# Patient Record
Sex: Female | Born: 1990 | Hispanic: Yes | Marital: Single | Smoking: Never smoker
Health system: Southern US, Community
[De-identification: ages and names within clinical notes are randomized; demographics above are authoritative.]

## PROBLEM LIST (undated history)

## (undated) DIAGNOSIS — E042 Nontoxic multinodular goiter: Secondary | ICD-10-CM

## (undated) DIAGNOSIS — E559 Vitamin D deficiency, unspecified: Secondary | ICD-10-CM

## (undated) DIAGNOSIS — L680 Hirsutism: Secondary | ICD-10-CM

## (undated) HISTORY — DX: Hirsutism: L68.0

## (undated) HISTORY — PX: NO PAST SURGERIES: SHX2092

## (undated) HISTORY — DX: Vitamin D deficiency, unspecified: E55.9

## (undated) HISTORY — DX: Nontoxic multinodular goiter: E04.2

## (undated) HISTORY — DX: Morbid (severe) obesity due to excess calories: E66.01

---

## 2019-01-04 ENCOUNTER — Other Ambulatory Visit: Payer: Self-pay

## 2019-01-04 ENCOUNTER — Emergency Department
Admission: EM | Admit: 2019-01-04 | Discharge: 2019-01-04 | Disposition: A | Discharge status: Home / Self Care | Attending: Emergency Medicine | Admitting: Emergency Medicine

## 2019-01-04 ENCOUNTER — Encounter: Payer: Self-pay | Admitting: Emergency Medicine

## 2019-01-04 DIAGNOSIS — R112 Nausea with vomiting, unspecified: Secondary | ICD-10-CM

## 2019-01-04 DIAGNOSIS — Z6841 Body Mass Index (BMI) 40.0 and over, adult: Secondary | ICD-10-CM | POA: Insufficient documentation

## 2019-01-04 DIAGNOSIS — R197 Diarrhea, unspecified: Secondary | ICD-10-CM

## 2019-01-04 DIAGNOSIS — E860 Dehydration: Secondary | ICD-10-CM | POA: Diagnosis not present

## 2019-01-04 LAB — GASTROINTESTINAL PANEL BY PCR, STOOL (REPLACES STOOL CULTURE)
Adenovirus F40/41: NOT DETECTED
Astrovirus: NOT DETECTED
CYCLOSPORA CAYETANENSIS: NOT DETECTED
Campylobacter species: NOT DETECTED
Cryptosporidium: NOT DETECTED
ENTAMOEBA HISTOLYTICA: NOT DETECTED
Enteroaggregative E coli (EAEC): NOT DETECTED
Enteropathogenic E coli (EPEC): NOT DETECTED
Enterotoxigenic E coli (ETEC): NOT DETECTED
Giardia lamblia: NOT DETECTED
Norovirus GI/GII: DETECTED — AB
Plesimonas shigelloides: NOT DETECTED
Rotavirus A: NOT DETECTED
Salmonella species: NOT DETECTED
Sapovirus (I, II, IV, and V): NOT DETECTED
Shiga like toxin producing E coli (STEC): NOT DETECTED
Shigella/Enteroinvasive E coli (EIEC): NOT DETECTED
VIBRIO CHOLERAE: NOT DETECTED
Vibrio species: NOT DETECTED
YERSINIA ENTEROCOLITICA: NOT DETECTED

## 2019-01-04 LAB — CBC WITH DIFFERENTIAL/PLATELET
Abs Immature Granulocytes: 0.06 10*3/uL (ref 0.00–0.07)
Basophils Absolute: 0 10*3/uL (ref 0.0–0.1)
Basophils Relative: 0 %
Eosinophils Absolute: 0 10*3/uL (ref 0.0–0.5)
Eosinophils Relative: 0 %
HCT: 45.2 % (ref 36.0–46.0)
Hemoglobin: 14.3 g/dL (ref 12.0–15.0)
Immature Granulocytes: 0 %
Lymphocytes Relative: 2 %
Lymphs Abs: 0.3 10*3/uL — ABNORMAL LOW (ref 0.7–4.0)
MCH: 25.9 pg — ABNORMAL LOW (ref 26.0–34.0)
MCHC: 31.6 g/dL (ref 30.0–36.0)
MCV: 81.7 fL (ref 80.0–100.0)
Monocytes Absolute: 0.4 10*3/uL (ref 0.1–1.0)
Monocytes Relative: 3 %
Neutro Abs: 14 10*3/uL — ABNORMAL HIGH (ref 1.7–7.7)
Neutrophils Relative %: 95 %
Platelets: 334 10*3/uL (ref 150–400)
RBC: 5.53 MIL/uL — ABNORMAL HIGH (ref 3.87–5.11)
RDW: 14.2 % (ref 11.5–15.5)
WBC: 14.8 10*3/uL — ABNORMAL HIGH (ref 4.0–10.5)
nRBC: 0 % (ref 0.0–0.2)

## 2019-01-04 LAB — COMPREHENSIVE METABOLIC PANEL
ALT: 18 U/L (ref 0–44)
AST: 22 U/L (ref 15–41)
Albumin: 4.2 g/dL (ref 3.5–5.0)
Alkaline Phosphatase: 69 U/L (ref 38–126)
Anion gap: 9 (ref 5–15)
BUN: 20 mg/dL (ref 6–20)
CO2: 23 mmol/L (ref 22–32)
Calcium: 9.2 mg/dL (ref 8.9–10.3)
Chloride: 103 mmol/L (ref 98–111)
Creatinine, Ser: 0.9 mg/dL (ref 0.44–1.00)
GFR calc Af Amer: >60 mL/min (ref >60)
GFR calc non Af Amer: >60 mL/min (ref >60)
Glucose, Bld: 160 mg/dL — ABNORMAL HIGH (ref 70–99)
POTASSIUM: 3.9 mmol/L (ref 3.5–5.1)
Sodium: 135 mmol/L (ref 135–145)
Total Bilirubin: 0.8 mg/dL (ref 0.3–1.2)
Total Protein: 8.5 g/dL — ABNORMAL HIGH (ref 6.5–8.1)

## 2019-01-04 LAB — URINALYSIS, COMPLETE (UACMP) WITH MICROSCOPIC
Bilirubin Urine: NEGATIVE
GLUCOSE, UA: NEGATIVE mg/dL
Ketones, ur: NEGATIVE mg/dL
Nitrite: NEGATIVE
Protein, ur: NEGATIVE mg/dL
Specific Gravity, Urine: 1.031 — ABNORMAL HIGH (ref 1.005–1.030)
pH: 5 (ref 5.0–8.0)

## 2019-01-04 LAB — C DIFFICILE QUICK SCREEN W PCR REFLEX
C Diff antigen: NEGATIVE
C Diff interpretation: NOT DETECTED
C Diff toxin: NEGATIVE

## 2019-01-04 LAB — LIPASE, BLOOD: Lipase: 26 U/L (ref 11–51)

## 2019-01-04 LAB — BETA-HYDROXYBUTYRIC ACID: Beta-Hydroxybutyric Acid: 0.09 mmol/L (ref 0.05–0.27)

## 2019-01-04 MED ORDER — ONDANSETRON HCL 4 MG/2ML IJ SOLN
4.0000 mg | Freq: Once | INTRAMUSCULAR | Status: AC
  Administered 2019-01-04: 4 mg via INTRAVENOUS
  Filled 2019-01-04: qty 2

## 2019-01-04 MED ORDER — SODIUM CHLORIDE 0.9 % IV BOLUS
1000.0000 mL | Freq: Once | INTRAVENOUS | Status: AC
  Administered 2019-01-04: 1000 mL via INTRAVENOUS

## 2019-01-04 MED ORDER — LOPERAMIDE HCL 2 MG PO CAPS
4.0000 mg | ORAL_CAPSULE | Freq: Once | ORAL | Status: AC
  Administered 2019-01-04: 4 mg via ORAL
  Filled 2019-01-04: qty 2

## 2019-01-04 MED ORDER — ONDANSETRON 4 MG PO TBDP: 4.0000 mg | ORAL_TABLET | Freq: Three times a day (TID) | ORAL | 0 refills | Status: DC | PRN

## 2019-01-04 MED ORDER — DICYCLOMINE HCL 10 MG/ML IM SOLN
20.0000 mg | Freq: Once | INTRAMUSCULAR | Status: AC
  Administered 2019-01-04: 20 mg via INTRAMUSCULAR
  Filled 2019-01-04: qty 2

## 2019-01-04 NOTE — ED Notes (Signed)
Topaz not working 

## 2019-01-04 NOTE — ED Provider Notes (Signed)
[facility] Emergency Department Provider Note  ____________________________________________   First MD Initiated Contact with Patient 01/04/19 [phone]     (approximate)  I have reviewed the triage vital signs and the nursing notes.   HISTORY  Chief Complaint Emesis and Diarrhea   HPI Jessica Mcfarland is a 28 y.o. female who self presents to the emergency department with nausea vomiting and diarrhea that began acutely at 10 PM last night.  The patient recently took a course of azithromycin and amoxicillin that she purchased over-the-counter in [place] for unclear reasons.  She did not see a doctor and said she took the medications for being "sick".  She is also changed her diet recently to plant-based and organic although that was 4 weeks ago.  Her symptoms came on suddenly were severe and she has had more bowel movements than she can count.  It is all "liquid" coming out.  No history of abdominal surgeries.  No fevers or chills.  Her abdominal pain is moderate severity cramping diffuse nonradiating.    History reviewed. No pertinent past medical history.  There are no active problems to display for this patient.   History reviewed. No pertinent surgical history.  Prior to Admission medications   Medication Sig Start Date End Date Taking? Authorizing Provider  ondansetron (ZOFRAN ODT) 4 MG disintegrating tablet Take 1 tablet (4 mg total) by mouth every 8 (eight) hours as needed for nausea or vomiting. 01/04/19   Merrily Brittle, MD    Allergies Patient has no known allergies.  No family history on file.  Social History Social History   Tobacco Use  . Smoking status: Not on file  Substance Use Topics  . Alcohol use: Not on file  . Drug use: Not on file    Review of Systems Constitutional: No fever/chills Eyes: No visual changes. ENT: No sore throat. Cardiovascular: Denies chest pain. Respiratory: Denies shortness of  breath. Gastrointestinal: Positive for abdominal pain.  Positive for nausea, positive for vomiting.  Positive for diarrhea.  No constipation. Genitourinary: Negative for dysuria. Musculoskeletal: Negative for back pain. Skin: Negative for rash. Neurological: Negative for headaches, focal weakness or numbness.   ____________________________________________   PHYSICAL EXAM:  VITAL SIGNS: ED Triage Vitals  Enc Vitals Group     BP 01/04/19 0538 118/63     Pulse Rate 01/04/19 0538 (!) 125     Resp 01/04/19 0538 20     Temp 01/04/19 0538 98.5 F (36.9 C)     Temp Source 01/04/19 0538 Oral     SpO2 01/04/19 0538 98 %     Weight 01/04/19 0537 287 lb (130.2 kg)     Height 01/04/19 0537 5\' 9"  (1.753 m)     Head Circumference --      Peak Flow --      Pain Score 01/04/19 0537 0     Pain Loc --      Pain Edu? --      Excl. in GC? --     Constitutional: Alert and oriented x4 appears somewhat uncomfortable nontoxic no diaphoresis speaks in full clear sentences Eyes: PERRL EOMI. Head: Atraumatic. Nose: No congestion/rhinnorhea. Mouth/Throat: No trismus Neck: No stridor.   Cardiovascular: Tachycardic rate, regular rhythm. Grossly normal heart sounds.  Good peripheral circulation. Respiratory: Normal respiratory effort.  No retractions. Lungs CTAB and moving good air Gastrointestinal: Morbidly obese abdomen soft mild diffuse tenderness with no focality no rebound or guarding no peritonitis hyperactive bowel sounds Musculoskeletal:  No lower extremity edema   Neurologic:  Normal speech and language. No gross focal neurologic deficits are appreciated. Skin:  Skin is warm, dry and intact. No rash noted. Psychiatric: Mood and affect are normal. Speech and behavior are normal.    ____________________________________________   DIFFERENTIAL includes but not limited to  Viral gastroenteritis, bacterial gastroenteritis, C. difficile, appendicitis,  diverticulitis ____________________________________________   LABS (all labs ordered are listed, but only abnormal results are displayed)  Labs Reviewed  COMPREHENSIVE METABOLIC PANEL - Abnormal; Notable for the following components:      Result Value   Glucose, Bld 160 (*)    Total Protein 8.5 (*)    All other components within normal limits  CBC WITH DIFFERENTIAL/PLATELET - Abnormal; Notable for the following components:   WBC 14.8 (*)    RBC 5.53 (*)    MCH 25.9 (*)    Neutro Abs 14.0 (*)    Lymphs Abs 0.3 (*)    All other components within normal limits  URINALYSIS, COMPLETE (UACMP) WITH MICROSCOPIC - Abnormal; Notable for the following components:   Color, Urine YELLOW (*)    APPearance TURBID (*)    Specific Gravity, Urine 1.031 (*)    Hgb urine dipstick SMALL (*)    Leukocytes, UA MODERATE (*)    Bacteria, UA MANY (*)    All other components within normal limits  GASTROINTESTINAL PANEL BY PCR, STOOL (REPLACES STOOL CULTURE)  C DIFFICILE QUICK SCREEN W PCR REFLEX  LIPASE, BLOOD  BETA-HYDROXYBUTYRIC ACID    Lab work reviewed by me with slightly elevated white count which is nonspecific otherwise unremarkable __________________________________________  EKG   ____________________________________________  RADIOLOGY  ____________________________________________   PROCEDURES  Procedure(s) performed: no  Procedures  Critical Care performed: no  ____________________________________________   INITIAL IMPRESSION / ASSESSMENT AND PLAN / ED COURSE  Pertinent labs & imaging results that were available during my care of the patient were reviewed by me and considered in my medical decision making (see chart for details).   As part of my medical decision making, I reviewed the following data within the electronic MEDICAL RECORD NUMBER History obtained from family if available, nursing notes, old chart and ekg, as well as notes from prior ED visits.  Patient comes to  the emergency department somewhat uncomfortable appearing with cramping diffuse abdominal pain nausea vomiting and diarrhea that began acutely last night.  Her symptoms are most consistent with either food poisoning or infectious gastroenteritis.  Given a liter of fluids with IV Zofran and intramuscular Bentyl with improvement in her symptoms.  We did send off a stool sample given her recent antibiotic use to evaluate for C. difficile.  Will be discharged home with loperamide and Zofran.  Strict return precautions have been given and the patient verbalizes understanding and agreement with the plan.      ____________________________________________   FINAL CLINICAL IMPRESSION(S) / ED DIAGNOSES  Final diagnoses:  Dehydration  Nausea vomiting and diarrhea  Morbid obesity (HCC)      NEW MEDICATIONS STARTED DURING THIS VISIT:  New Prescriptions   ONDANSETRON (ZOFRAN ODT) 4 MG DISINTEGRATING TABLET    Take 1 tablet (4 mg total) by mouth every 8 (eight) hours as needed for nausea or vomiting.     Note:  This document was prepared using Dragon voice recognition software and may include unintentional dictation errors.    Merrily Brittle, MD 01/04/19 [phone]

## 2019-01-04 NOTE — ED Triage Notes (Signed)
Patient ambulatory to triage with steady gait, without difficulty or distress noted; pt reports N/V/D since 10pm; began organic and plant based food diet 4wks ago

## 2019-01-04 NOTE — Discharge Instructions (Addendum)
Please take over the counter loperamide as needed for diarrhea.  Take one tablet after every loose stool up to a total of 8 a day.  Use zofran as needed for nausea and follow up with primary care this coming Friday for a recheck.  Return to the ED sooner for any concerns such as worsening pain, fevers, chills, if you cannot eat or drink, or for any other concerns whatsoever.  It was a pleasure to take care of you today, and thank you for coming to our emergency department.  If you have any questions or concerns before leaving please ask the nurse to grab me and I'm more than happy to go through your aftercare instructions again.  If you were prescribed any opioid pain medication today such as Norco, Vicodin, Percocet, morphine, hydrocodone, or oxycodone please make sure you do not drive when you are taking this medication as it can alter your ability to drive safely.  If you have any concerns once you are home that you are not improving or are in fact getting worse before you can make it to your follow-up appointment, please do not hesitate to call 911 and come back for further evaluation.  Merrily Brittle, MD  Results for orders placed or performed during the hospital encounter of 01/04/19  Comprehensive metabolic panel  Result Value Ref Range   Sodium 135 135 - 145 mmol/L   Potassium 3.9 3.5 - 5.1 mmol/L   Chloride 103 98 - 111 mmol/L   CO2 23 22 - 32 mmol/L   Glucose, Bld 160 (H) 70 - 99 mg/dL   BUN 20 6 - 20 mg/dL   Creatinine, Ser 7.10 0.44 - 1.00 mg/dL   Calcium 9.2 8.9 - 62.6 mg/dL   Total Protein 8.5 (H) 6.5 - 8.1 g/dL   Albumin 4.2 3.5 - 5.0 g/dL   AST 22 15 - 41 U/L   ALT 18 0 - 44 U/L   Alkaline Phosphatase 69 38 - 126 U/L   Total Bilirubin 0.8 0.3 - 1.2 mg/dL   GFR calc non Af Amer >60 >60 mL/min   GFR calc Af Amer >60 >60 mL/min   Anion gap 9 5 - 15  Lipase, blood  Result Value Ref Range   Lipase 26 11 - 51 U/L  CBC with Differential  Result Value Ref Range   WBC 14.8  (H) 4.0 - 10.5 K/uL   RBC 5.53 (H) 3.87 - 5.11 MIL/uL   Hemoglobin 14.3 12.0 - 15.0 g/dL   HCT 94.8 54.6 - 27.0 %   MCV 81.7 80.0 - 100.0 fL   MCH 25.9 (L) 26.0 - 34.0 pg   MCHC 31.6 30.0 - 36.0 g/dL   RDW 35.0 09.3 - 81.8 %   Platelets 334 150 - 400 K/uL   nRBC 0.0 0.0 - 0.2 %   Neutrophils Relative % 95 %   Neutro Abs 14.0 (H) 1.7 - 7.7 K/uL   Lymphocytes Relative 2 %   Lymphs Abs 0.3 (L) 0.7 - 4.0 K/uL   Monocytes Relative 3 %   Monocytes Absolute 0.4 0.1 - 1.0 K/uL   Eosinophils Relative 0 %   Eosinophils Absolute 0.0 0.0 - 0.5 K/uL   Basophils Relative 0 %   Basophils Absolute 0.0 0.0 - 0.1 K/uL   Immature Granulocytes 0 %   Abs Immature Granulocytes 0.06 0.00 - 0.07 K/uL

## 2019-01-05 NOTE — ED Notes (Signed)
Called patient with gi panel results.  She has not had diarrhea today.  Told her not to take any more anti diarrheals unless she gets diarrhea again.  Also explained importance of washing hands with soap and water as opposed to hand gels.

## 2020-03-03 ENCOUNTER — Ambulatory Visit: Attending: Internal Medicine

## 2020-03-03 DIAGNOSIS — Z23 Encounter for immunization: Secondary | ICD-10-CM

## 2020-03-03 NOTE — Progress Notes (Signed)
   Covid-19 Vaccination Clinic  Name:  Timia Casselman    MRN: [id number] DOB: [birth year]  03/03/2020  Ms. Trinna Balloon was observed post Covid-19 immunization for 15 minutes without incident. She was provided with Vaccine Information Sheet and instruction to access the V-Safe system.   Ms. Trinna Balloon was instructed to call 911 with any severe reactions post vaccine: Marland Kitchen Difficulty breathing  . Swelling of face and throat  . A fast heartbeat  . A bad rash all over body  . Dizziness and weakness   Immunizations Administered    Name Date Dose VIS Date Route   Pfizer COVID-19 Vaccine 03/03/2020 11:34 AM 0.3 mL 11/24/2019 Intramuscular   Manufacturer: [employer], [employer]   Lot: [id number]   NDC: [address]-2

## 2020-03-24 ENCOUNTER — Ambulatory Visit: Attending: Internal Medicine

## 2020-03-24 DIAGNOSIS — Z23 Encounter for immunization: Secondary | ICD-10-CM

## 2020-03-24 NOTE — Progress Notes (Signed)
   Covid-19 Vaccination Clinic  Name:  Breianna Delfino    MRN: [id number] DOB: Feb 21, 1991  03/24/2020  Ms. Trinna Balloon was observed post Covid-19 immunization for 15 minutes without incident. She was provided with Vaccine Information Sheet and instruction to access the V-Safe system.   Ms. Trinna Balloon was instructed to call 911 with any severe reactions post vaccine: Marland Kitchen Difficulty breathing  . Swelling of face and throat  . A fast heartbeat  . A bad rash all over body  . Dizziness and weakness   Immunizations Administered    Name Date Dose VIS Date Route   Pfizer COVID-19 Vaccine 03/24/2020 11:34 AM 0.3 mL 11/24/2019 Intramuscular   Manufacturer: [employer], [employer]   Lot: [id number]   NDC: [address]-3

## 2020-11-11 ENCOUNTER — Other Ambulatory Visit: Payer: Self-pay

## 2020-11-11 ENCOUNTER — Ambulatory Visit
Admission: EM | Admit: 2020-11-11 | Discharge: 2020-11-11 | Disposition: A | Discharge status: Home / Self Care | Attending: Emergency Medicine | Admitting: Emergency Medicine

## 2020-11-11 DIAGNOSIS — Z20822 Contact with and (suspected) exposure to covid-19: Secondary | ICD-10-CM | POA: Insufficient documentation

## 2020-11-11 DIAGNOSIS — R112 Nausea with vomiting, unspecified: Secondary | ICD-10-CM | POA: Diagnosis present

## 2020-11-11 DIAGNOSIS — R197 Diarrhea, unspecified: Secondary | ICD-10-CM | POA: Diagnosis not present

## 2020-11-11 LAB — RESP PANEL BY RT-PCR (FLU A&B, COVID) ARPGX2
Influenza A by PCR: NEGATIVE
Influenza B by PCR: NEGATIVE
SARS Coronavirus 2 by RT PCR: NEGATIVE

## 2020-11-11 MED ORDER — ONDANSETRON 8 MG PO TBDP: 8.0000 mg | ORAL_TABLET | Freq: Three times a day (TID) | ORAL | 0 refills | Status: DC | PRN

## 2020-11-11 MED ORDER — DICYCLOMINE HCL 20 MG PO TABS: 20.0000 mg | ORAL_TABLET | Freq: Two times a day (BID) | ORAL | 0 refills | Status: DC

## 2020-11-11 NOTE — ED Provider Notes (Signed)
[facility]    CSN: [id number] Arrival date & time: 11/11/20  1106      History   Chief Complaint Chief Complaint  Patient presents with   Generalized Body Aches    HPI Breslyn Abdo is a 29 y.o. female.   HPI   29 year old female here for evaluation of fever, chills, body aches, nausea, vomiting, and diarrhea.  Patient reports that her symptoms started 2 days ago.  She was recently with her family for Thanksgiving and her dad had a similar symptoms the day prior the last 24 hours.  Patient reports that the highest her measured temp Was 99.  She has been taking a Z-Pak that she got over-the-counter when she was last in [place]  She denies URI symptoms, or changes in appetite.  She is not seen any blood in her stool.  She does report that she is going 6-7 times a day for moderate volumes of stool.  Patient is able to take in oral fluids.  She does report whenever she eats that it goes right through her.  History reviewed. No pertinent past medical history.  There are no problems to display for this patient.   Past Surgical History:  Procedure Laterality Date   NO PAST SURGERIES      OB History   No obstetric history on file.      Home Medications    Prior to Admission medications   Medication Sig Start Date End Date Taking? Authorizing Provider  dicyclomine (BENTYL) 20 MG tablet Take 1 tablet (20 mg total) by mouth 2 (two) times daily. 11/11/20   Becky Augusta, NP  ondansetron (ZOFRAN ODT) 8 MG disintegrating tablet Take 1 tablet (8 mg total) by mouth every 8 (eight) hours as needed for nausea or vomiting. 11/11/20   Becky Augusta, NP    Family History Family History  Problem Relation Age of Onset   Arthritis Mother    Healthy Father     Social History Social History   Tobacco Use   Smoking status: Never Smoker   Smokeless tobacco: Never Used  Building services engineer Use: Never used  Substance Use Topics   Alcohol use:  Yes    Comment: socially   Drug use: Never     Allergies   Patient has no known allergies.   Review of Systems Review of Systems  Constitutional: Negative for activity change, appetite change and fever.  HENT: Negative for congestion, ear pain, sinus pain and sore throat.   Respiratory: Negative for cough and shortness of breath.   Cardiovascular: Negative for chest pain.  Gastrointestinal: Positive for abdominal pain, diarrhea, nausea and vomiting. Negative for blood in stool.  Genitourinary: Negative for dysuria, frequency and urgency.  Musculoskeletal: Positive for arthralgias and myalgias.  Skin: Negative for rash.  Neurological: Negative for syncope and headaches.  Hematological: Negative.   Psychiatric/Behavioral: Negative.      Physical Exam Triage Vital Signs ED Triage Vitals  Enc Vitals Group     BP 11/11/20 1238 (!) 146/99     Pulse Rate 11/11/20 1238 100     Resp 11/11/20 1238 18     Temp 11/11/20 1238 98.4 F (36.9 C)     Temp Source 11/11/20 1238 Oral     SpO2 11/11/20 1238 99 %     Weight 11/11/20 1235 (!) 304 lb (137.9 kg)     Height 11/11/20 1235 5\' 9"  (1.753 m)     Head Circumference --  Peak Flow --      Pain Score 11/11/20 1235 7     Pain Loc --      Pain Edu? --      Excl. in GC? --    No data found.  Updated Vital Signs BP (!) 146/99 (BP Location: Left Arm)    Pulse 100    Temp 98.4 F (36.9 C) (Oral)    Resp 18    Ht 5\' 9"  (1.753 m)    Wt (!) 304 lb (137.9 kg)    LMP 10/28/2020    SpO2 99%    BMI 44.89 kg/m   Visual Acuity Right Eye Distance:   Left Eye Distance:   Bilateral Distance:    Right Eye Near:   Left Eye Near:    Bilateral Near:     Physical Exam Vitals and nursing note reviewed.  Constitutional:      General: She is not in acute distress.    Appearance: Normal appearance. She is obese. She is not toxic-appearing.  HENT:     Head: Normocephalic and atraumatic.     Right Ear: Tympanic membrane, ear canal and  external ear normal. There is no impacted cerumen.     Left Ear: Tympanic membrane, ear canal and external ear normal.  Eyes:     General: No scleral icterus.    Extraocular Movements: Extraocular movements intact.     Conjunctiva/sclera: Conjunctivae normal.     Pupils: Pupils are equal, round, and reactive to light.  Cardiovascular:     Rate and Rhythm: Normal rate and regular rhythm.     Pulses: Normal pulses.     Heart sounds: Normal heart sounds. No murmur heard.  No gallop.   Pulmonary:     Effort: Pulmonary effort is normal.     Breath sounds: Normal breath sounds. No wheezing, rhonchi or rales.  Abdominal:     General: Bowel sounds are normal.     Palpations: Abdomen is soft.     Tenderness: There is no abdominal tenderness. There is no right CVA tenderness, guarding or rebound.  Musculoskeletal:        General: No swelling. Normal range of motion.     Cervical back: Normal range of motion and neck supple.  Lymphadenopathy:     Cervical: No cervical adenopathy.  Skin:    General: Skin is warm and dry.     Capillary Refill: Capillary refill takes less than 2 seconds.     Findings: No bruising, erythema or rash.  Neurological:     General: No focal deficit present.     Mental Status: She is alert and oriented to person, place, and time.  Psychiatric:        Mood and Affect: Mood normal.        Behavior: Behavior normal.        Thought Content: Thought content normal.        Judgment: Judgment normal.      UC Treatments / Results  Labs (all labs ordered are listed, but only abnormal results are displayed) Labs Reviewed  RESP PANEL BY RT-PCR (FLU A&B, COVID) [id number]    EKG   Radiology No results found.  Procedures Procedures (including critical care time)  Medications Ordered in UC Medications - No data to display  Initial Impression / Assessment and Plan / UC Course  I have reviewed the triage vital signs and the nursing notes.  Pertinent labs &  imaging results that were available during my care  of the patient were reviewed by me and considered in my medical decision making (see chart for details).   For evaluation of nausea vomiting diarrhea with associated elevated temp, chills and body aches that she has had for the past 2 days.  Patient is nontoxic-appearing, eyes mucous membranes are bright and shiny without stickiness.  Lung sounds clear to auscultation, abdomen is benign.  Patient does report that this cluster of symptoms is similar to when she had a GI bug in the past.  Patient had a [facility] respiratory panel that was negative for Covid and flu collected at triage.  Given patient's recent history of antibiotic usage will order stool PCR and C. difficile for patient to collect outpatient.  Will give Zofran for nausea and Bentyl for cramping.  Patient given instructions to go to the ER for IV fluids if she is unable to take in enough fluid by mouth to replace that which she is losing through her stool, or if she develops vomiting again.   Final Clinical Impressions(s) / UC Diagnoses   Final diagnoses:  Nausea, vomiting, and diarrhea     Discharge Instructions     Use the Zofran every 8 hours as needed for nausea and vomiting.  Use the Bentyl every 6 hours as needed for abdominal cramping.  Rehydrate with broth, Pedialyte, ginger ale.  If you are able to collect a stool sample please put in the containers provided and bring it back to the lab for analysis.  If you are not able to take in enough fluids to replace what you are losing through stool, meaning you become dizzy or fatigued, then you need to go to the ER for IV fluid replacement.    ED Prescriptions    Medication Sig Dispense Auth. Provider   ondansetron (ZOFRAN ODT) 8 MG disintegrating tablet Take 1 tablet (8 mg total) by mouth every 8 (eight) hours as needed for nausea or vomiting. 20 tablet Becky Augusta, NP   dicyclomine (BENTYL) 20 MG tablet Take 1 tablet (20  mg total) by mouth 2 (two) times daily. 20 tablet Becky Augusta, NP     PDMP not reviewed this encounter.   Becky Augusta, NP 11/11/20 1425

## 2020-11-11 NOTE — Discharge Instructions (Addendum)
Use the Zofran every 8 hours as needed for nausea and vomiting.  Use the Bentyl every 6 hours as needed for abdominal cramping.  Rehydrate with broth, Pedialyte, ginger ale.  If you are able to collect a stool sample please put in the containers provided and bring it back to the lab for analysis.  If you are not able to take in enough fluids to replace what you are losing through stool, meaning you become dizzy or fatigued, then you need to go to the ER for IV fluid replacement.

## 2020-11-11 NOTE — ED Triage Notes (Addendum)
Patient states that she has been having fever, chills, body aches, nausea, diarrhea x Saturday. Patient started a zpack that she got OTC in [place]

## 2020-11-13 ENCOUNTER — Other Ambulatory Visit
Admission: RE | Admit: 2020-11-13 | Discharge: 2020-11-13 | Disposition: A | Discharge status: Home / Self Care | Source: Ambulatory Visit | Attending: Emergency Medicine | Admitting: Emergency Medicine

## 2020-11-13 DIAGNOSIS — R112 Nausea with vomiting, unspecified: Secondary | ICD-10-CM | POA: Diagnosis not present

## 2020-11-13 DIAGNOSIS — M791 Myalgia, unspecified site: Secondary | ICD-10-CM | POA: Insufficient documentation

## 2020-11-13 DIAGNOSIS — R197 Diarrhea, unspecified: Secondary | ICD-10-CM | POA: Diagnosis not present

## 2020-11-13 DIAGNOSIS — A0832 Astrovirus enteritis: Secondary | ICD-10-CM | POA: Insufficient documentation

## 2020-11-13 DIAGNOSIS — R509 Fever, unspecified: Secondary | ICD-10-CM | POA: Insufficient documentation

## 2020-11-13 LAB — C DIFFICILE QUICK SCREEN W PCR REFLEX
C Diff antigen: NEGATIVE
C Diff interpretation: NOT DETECTED
C Diff toxin: NEGATIVE

## 2020-11-14 LAB — GASTROINTESTINAL PANEL BY PCR, STOOL (REPLACES STOOL CULTURE)

## 2020-11-22 DIAGNOSIS — L680 Hirsutism: Secondary | ICD-10-CM | POA: Insufficient documentation

## 2020-11-22 DIAGNOSIS — E042 Nontoxic multinodular goiter: Secondary | ICD-10-CM | POA: Insufficient documentation

## 2020-11-22 DIAGNOSIS — Z6841 Body Mass Index (BMI) 40.0 and over, adult: Secondary | ICD-10-CM | POA: Insufficient documentation

## 2020-11-26 DIAGNOSIS — R7303 Prediabetes: Secondary | ICD-10-CM | POA: Insufficient documentation

## 2020-12-16 ENCOUNTER — Emergency Department
Admission: EM | Admit: 2020-12-16 | Discharge: 2020-12-16 | Disposition: A | Discharge status: Home / Self Care | Attending: Student in an Organized Health Care Education/Training Program | Admitting: Student in an Organized Health Care Education/Training Program

## 2020-12-16 ENCOUNTER — Encounter: Payer: Self-pay | Admitting: Emergency Medicine

## 2020-12-16 ENCOUNTER — Other Ambulatory Visit: Payer: Self-pay

## 2020-12-16 ENCOUNTER — Emergency Department

## 2020-12-16 DIAGNOSIS — M545 Low back pain, unspecified: Secondary | ICD-10-CM | POA: Insufficient documentation

## 2020-12-16 DIAGNOSIS — M5136 Other intervertebral disc degeneration, lumbar region: Secondary | ICD-10-CM

## 2020-12-16 DIAGNOSIS — M5126 Other intervertebral disc displacement, lumbar region: Secondary | ICD-10-CM

## 2020-12-16 DIAGNOSIS — N3000 Acute cystitis without hematuria: Secondary | ICD-10-CM

## 2020-12-16 LAB — URINALYSIS, COMPLETE (UACMP) WITH MICROSCOPIC
Bilirubin Urine: NEGATIVE
Glucose, UA: NEGATIVE mg/dL
Hgb urine dipstick: NEGATIVE
Ketones, ur: NEGATIVE mg/dL
Nitrite: NEGATIVE
Protein, ur: NEGATIVE mg/dL
Specific Gravity, Urine: 1.026 (ref 1.005–1.030)
pH: 6 (ref 5.0–8.0)

## 2020-12-16 LAB — POC URINE PREG, ED: Preg Test, Ur: NEGATIVE

## 2020-12-16 MED ORDER — NAPROXEN 500 MG PO TABS: 500.0000 mg | ORAL_TABLET | Freq: Two times a day (BID) | ORAL | 0 refills | Status: DC

## 2020-12-16 MED ORDER — METHOCARBAMOL 500 MG PO TABS: 500.0000 mg | ORAL_TABLET | Freq: Four times a day (QID) | ORAL | 0 refills | Status: DC | PRN

## 2020-12-16 NOTE — Discharge Instructions (Addendum)
You do not have a kidney stone. Follow up with the primary care provider of your choice if back pain does not improve with medication, rest, and time.

## 2020-12-16 NOTE — ED Provider Notes (Signed)
[facility] Emergency Department Provider Note  ____________________________________________  Time seen: Approximately 6:02 PM  I have reviewed the triage vital signs and the nursing notes.   HISTORY  Chief Complaint Back Pain    HPI Jessica Mcfarland is a 30 y.o. female that presents to the emergency department for evaluation of left low back pain for 3 days.  Pain started in the morning following an evening of bowling.  Pain is to the low left side.  It does not radiate.  Pain is worse when she is moving around or walking.  She did not have any specific trauma.  No bowel or bladder dysfunction or saddle anesthesias.  She denies any previous issues with her back.  No fever, chills, nausea, vomiting, abdominal pain, radiculopathy, weakness.  History reviewed. No pertinent past medical history.  There are no problems to display for this patient.   Past Surgical History:  Procedure Laterality Date  . NO PAST SURGERIES      Prior to Admission medications   Medication Sig Start Date End Date Taking? Authorizing Provider  dicyclomine (BENTYL) 20 MG tablet Take 1 tablet (20 mg total) by mouth 2 (two) times daily. 11/11/20   Becky Augusta, NP  ondansetron (ZOFRAN ODT) 8 MG disintegrating tablet Take 1 tablet (8 mg total) by mouth every 8 (eight) hours as needed for nausea or vomiting. 11/11/20   Becky Augusta, NP    Allergies Patient has no known allergies.  Family History  Problem Relation Age of Onset  . Arthritis Mother   . Healthy Father     Social History Social History   Tobacco Use  . Smoking status: Never Smoker  . Smokeless tobacco: Never Used  Vaping Use  . Vaping Use: Never used  Substance Use Topics  . Alcohol use: Yes    Comment: socially  . Drug use: Never     Review of Systems  Cardiovascular: No chest pain. Respiratory: No SOB. Gastrointestinal: No abdominal pain.  No nausea, no vomiting.  Musculoskeletal: Positive  for back pain. Skin: Negative for rash, abrasions, lacerations, ecchymosis. Neurological: Negative for headaches   ____________________________________________   PHYSICAL EXAM:  VITAL SIGNS: ED Triage Vitals  Enc Vitals Group     BP 12/16/20 1720 128/69     Pulse Rate 12/16/20 1720 65     Resp 12/16/20 1720 17     Temp 12/16/20 1720 98.5 F (36.9 C)     Temp Source 12/16/20 1720 Oral     SpO2 12/16/20 1720 100 %     Weight 12/16/20 1721 (!) 310 lb (140.6 kg)     Height 12/16/20 1721 5\' 9"  (1.753 m)     Head Circumference --      Peak Flow --      Pain Score 12/16/20 1721 7     Pain Loc --      Pain Edu? --      Excl. in GC? --      Constitutional: Alert and oriented. Well appearing and in no acute distress. Eyes: Conjunctivae are normal. PERRL. EOMI. Head: Atraumatic. ENT:      Ears:      Nose: No congestion/rhinnorhea.      Mouth/Throat: Mucous membranes are moist.  Neck: No stridor.  Cardiovascular: Normal rate, regular rhythm.  Good peripheral circulation. Respiratory: Normal respiratory effort without tachypnea or retractions. Lungs CTAB. Good air entry to the bases with no decreased or absent breath sounds. Gastrointestinal: Bowel sounds 4 quadrants. Soft  and nontender to palpation. No guarding or rigidity. No palpable masses. No distention.  Musculoskeletal: Full range of motion to all extremities. No gross deformities appreciated. Tenderness to palpation to left lumbar paraspinal muscles. Strength equal in lower extremities bilaterally. Normal gait. Neurologic:  Normal speech and language. No gross focal neurologic deficits are appreciated.  Skin:  Skin is warm, dry and intact. No rash noted. Psychiatric: Mood and affect are normal. Speech and behavior are normal. Patient exhibits appropriate insight and judgement.   ____________________________________________   LABS (all labs ordered are listed, but only abnormal results are displayed)  Labs Reviewed   URINALYSIS, COMPLETE (UACMP) WITH MICROSCOPIC  POC URINE PREG, ED   ____________________________________________  EKG   ____________________________________________  RADIOLOGY   No results found.  ____________________________________________    PROCEDURES  Procedure(s) performed:    Procedures    Medications - No data to display   ____________________________________________   INITIAL IMPRESSION / ASSESSMENT AND PLAN / ED COURSE  Pertinent labs & imaging results that were available during my care of the patient were reviewed by me and considered in my medical decision making (see chart for details).  Review of the [place] CSRS was performed in accordance of the [facility] prior to dispensing any controlled drugs.   Patient presented to the emergency department for evaluation of low back pain for 3 days.  Vital signs and exam are reassuring.  Urinalysis does showed some crystals so CT scan was ordered to further evaluate for possible kidney stone.  CT scan is waiting results.  Care was transferred to [facility] pending CT scan with plan for outpatient follow-up.    Jessica Mcfarland was evaluated in Emergency Department on 12/16/2020 for the symptoms described in the history of present illness. She was evaluated in the context of the global COVID-19 pandemic, which necessitated consideration that the patient might be at risk for infection with the SARS-CoV-2 virus that causes COVID-19. Institutional protocols and algorithms that pertain to the evaluation of patients at risk for COVID-19 are in a state of rapid change based on information released by regulatory bodies including the CDC and federal and state organizations. These policies and algorithms were followed during the patient's care in the ED.       This chart was dictated using voice recognition software/Dragon. Despite best efforts to proofread, errors can occur which can change the meaning. Any change  was purely unintentional.    Enid Derry, PA-C 12/17/20 1031    Jene Every, MD 12/17/20 1110

## 2020-12-16 NOTE — ED Triage Notes (Signed)
Pt comes into the ED via POV c/o lower back pain on the left side.  Pt states that she went bowling on SAturday and the pain got really bad yesterday with sharp pain.  Pt states the pain is worse when she steps with her left leg.  Denies any radiating pain down into the leg and denies any urinary problems.  Pt ambulatory to triage at this time and in NAD.

## 2020-12-17 NOTE — ED Provider Notes (Signed)
  Physical Exam  BP 129/69 (BP Location: Right Arm)   Pulse 65   Temp 98.5 F (36.9 C) (Oral)   Resp 18   Ht 5\' 9"  (1.753 m)   Wt (!) 140.6 kg   LMP 11/25/2020 Comment: neg preg test 12/16/20  SpO2 100%   BMI 45.78 kg/m   Physical Exam  ED Course/Procedures     Procedures  MDM  CT for renal stone was negative for nephro or ureterolithiasis.  It does show that she has a disc bulge in the lower lumbar.  This is likely the cause of her pain.  She does have some indication of UTI but does not have any current symptoms.  Plan will be to culture the urine and treat if necessary.  Patient advised to follow-up with her primary care provider or return to the emergency department for symptoms that change, worsen, or for new concerns.       02/13/21, FNP 12/17/20 0032    02/14/21, MD 12/17/20 2129

## 2020-12-18 LAB — URINE CULTURE: Culture: 30000 — AB

## 2021-06-02 ENCOUNTER — Emergency Department

## 2021-06-02 ENCOUNTER — Other Ambulatory Visit: Payer: Self-pay

## 2021-06-02 ENCOUNTER — Encounter: Payer: Self-pay | Admitting: Emergency Medicine

## 2021-06-02 ENCOUNTER — Emergency Department
Admission: EM | Admit: 2021-06-02 | Discharge: 2021-06-02 | Disposition: A | Discharge status: Home / Self Care | Attending: Emergency Medicine | Admitting: Emergency Medicine

## 2021-06-02 DIAGNOSIS — M79671 Pain in right foot: Secondary | ICD-10-CM | POA: Insufficient documentation

## 2021-06-02 DIAGNOSIS — M79661 Pain in right lower leg: Secondary | ICD-10-CM

## 2021-06-02 LAB — CBC WITH DIFFERENTIAL/PLATELET
Abs Immature Granulocytes: 0.02 10*3/uL (ref 0.00–0.07)
Basophils Absolute: 0 10*3/uL (ref 0.0–0.1)
Basophils Relative: 0 %
Eosinophils Absolute: 0.3 10*3/uL (ref 0.0–0.5)
Eosinophils Relative: 3 %
HCT: 40.5 % (ref 36.0–46.0)
Hemoglobin: 13.2 g/dL (ref 12.0–15.0)
Immature Granulocytes: 0 %
Lymphocytes Relative: 27 %
Lymphs Abs: 3 10*3/uL (ref 0.7–4.0)
MCH: 26.8 pg (ref 26.0–34.0)
MCHC: 32.6 g/dL (ref 30.0–36.0)
MCV: 82.2 fL (ref 80.0–100.0)
Monocytes Absolute: 0.6 10*3/uL (ref 0.1–1.0)
Monocytes Relative: 5 %
Neutro Abs: 7.3 10*3/uL (ref 1.7–7.7)
Neutrophils Relative %: 65 %
Platelets: 316 10*3/uL (ref 150–400)
RBC: 4.93 MIL/uL (ref 3.87–5.11)
RDW: 14.2 % (ref 11.5–15.5)
WBC: 11.3 10*3/uL — ABNORMAL HIGH (ref 4.0–10.5)
nRBC: 0 % (ref 0.0–0.2)

## 2021-06-02 LAB — COMPREHENSIVE METABOLIC PANEL
ALT: 16 U/L (ref 0–44)
AST: 16 U/L (ref 15–41)
Albumin: 4.3 g/dL (ref 3.5–5.0)
Alkaline Phosphatase: 78 U/L (ref 38–126)
Anion gap: 6 (ref 5–15)
BUN: 20 mg/dL (ref 6–20)
CO2: 26 mmol/L (ref 22–32)
Calcium: 9.3 mg/dL (ref 8.9–10.3)
Chloride: 106 mmol/L (ref 98–111)
Creatinine, Ser: 0.68 mg/dL (ref 0.44–1.00)
GFR, Estimated: >60 mL/min (ref >60)
Glucose, Bld: 99 mg/dL (ref 70–99)
Potassium: 4.3 mmol/L (ref 3.5–5.1)
Sodium: 138 mmol/L (ref 135–145)
Total Bilirubin: 0.5 mg/dL (ref 0.3–1.2)
Total Protein: 7.9 g/dL (ref 6.5–8.1)

## 2021-06-02 LAB — CK: Total CK: 132 U/L (ref 38–234)

## 2021-06-02 MED ORDER — METHOCARBAMOL 500 MG PO TABS: 500.0000 mg | ORAL_TABLET | Freq: Three times a day (TID) | ORAL | 0 refills | Status: AC | PRN

## 2021-06-02 MED ORDER — PREDNISONE 10 MG (21) PO TBPK: ORAL_TABLET | ORAL | 0 refills | Status: DC

## 2021-06-02 NOTE — Discharge Instructions (Addendum)
Take tapered steroid as directed.  You can take Robaxin before bed.

## 2021-06-02 NOTE — ED Triage Notes (Signed)
C/O right calf cramping since Saturday.  Also c/o right sided foot numbness, and since yesterday c/o right leg pain.

## 2021-06-02 NOTE — ED Notes (Signed)
See triage note   Presents with some pain to right lateral foot  States feels like it is numb   Then developed pain to posterior lower leg 2 days ago  Denies any injury  states pain is now moving up leg

## 2021-06-02 NOTE — ED Provider Notes (Signed)
  Physical Exam  BP (!) 147/82 (BP Location: Left Arm)   Pulse 73   Temp 98.8 F (37.1 C) (Oral)   Resp 16   Ht 5\' 9"  (1.753 m)   Wt (!) 140.6 kg   LMP 05/14/2021   SpO2 100%   BMI 45.77 kg/m   Physical Exam  ED Course/Procedures     Procedures  MDM   Assumed patient care from 07/14/2021, NP.  Patient's venous ultrasound showed no signs of DVT Differential diagnosis also includes rhabdo and hypokalemia.  Will obtain basic labs and will reassess.   ----------------------------------------- 5:06 PM on 06/02/2021 ----------------------------------------- CBC, CMP and CK within reference range.  We will treat with tapered steroid as a suspect lumbar radiculopathy.  Return precautions were given to return with new or worsening symptoms.       06/04/2021 [place], PA-C 06/02/21 1706    06/04/21, MD 06/02/21 2245

## 2021-06-06 ENCOUNTER — Other Ambulatory Visit: Payer: Self-pay

## 2021-06-06 ENCOUNTER — Emergency Department
Admission: EM | Admit: 2021-06-06 | Discharge: 2021-06-07 | Disposition: A | Discharge status: Home / Self Care | Attending: Emergency Medicine | Admitting: Emergency Medicine

## 2021-06-06 ENCOUNTER — Emergency Department

## 2021-06-06 DIAGNOSIS — M79661 Pain in right lower leg: Secondary | ICD-10-CM | POA: Diagnosis present

## 2021-06-06 DIAGNOSIS — Y9389 Activity, other specified: Secondary | ICD-10-CM | POA: Diagnosis not present

## 2021-06-06 DIAGNOSIS — M7051 Other bursitis of knee, right knee: Secondary | ICD-10-CM | POA: Diagnosis not present

## 2021-06-06 NOTE — ED Triage Notes (Signed)
Pt states she is having pain in her R calf and numbness in her foot- pt states she was seen here for same on Monday- pt states that there is now a lump in her R knee- pt states it is painful to walk on

## 2021-06-07 NOTE — ED Provider Notes (Signed)
[facility] Emergency Department Provider Note  ____________________________________________   Event Date/Time   First MD Initiated Contact with Patient 06/06/21 2358     (approximate)  I have reviewed the triage vital signs and the nursing notes.   HISTORY  Chief Complaint Leg Pain    HPI Jessica Mcfarland is a 30 y.o. female who was seen recently emergency department for some pain and numbness in her right foot and calf.  She presents tonight for evaluation of some swelling that is developed over the last couple of days to the outer part of her right knee.  No known trauma, no penetrating injury.  No redness.  Discomfort but not severe pain.  Worse when she ambulates and bears weight.  No history of gout or arthritis of which she is aware.  She works a Health and safety inspector job and, in her words, sits all the time.  Discomfort when she flexes and extends her knees but not severe pain.  She denies fever, sore throat, chest pain, shortness of breath, nausea, vomiting, and abdominal pain.  She still has some numbness in her foot thought to be due to a bulging disc in her back.  Nothing in particular makes her symptoms better.  No history of blood clots in the legs or the lungs.     History reviewed. No pertinent past medical history.  There are no problems to display for this patient.   Past Surgical History:  Procedure Laterality Date   NO PAST SURGERIES      Prior to Admission medications   Medication Sig Start Date End Date Taking? Authorizing Provider  dicyclomine (BENTYL) 20 MG tablet Take 1 tablet (20 mg total) by mouth 2 (two) times daily. 11/11/20   Becky Augusta, NP  methocarbamol (ROBAXIN) 500 MG tablet Take 1 tablet (500 mg total) by mouth every 8 (eight) hours as needed for up to 5 days. 06/02/21 06/07/21  Orvil Feil, PA-C  predniSONE (STERAPRED UNI-PAK 21 TAB) 10 MG (21) TBPK tablet Take 6 tablets the first day, take 5 tablets the second day,  take 4 tablets the third day, take 3 tablets the fourth day, take 2 tablets the fifth day, take 1 tablet the sixth day. 06/02/21   Orvil Feil, PA-C    Allergies Patient has no known allergies.  Family History  Problem Relation Age of Onset   Arthritis Mother    Healthy Father     Social History Social History   Tobacco Use   Smoking status: Never   Smokeless tobacco: Never  Vaping Use   Vaping Use: Never used  Substance Use Topics   Alcohol use: Yes    Comment: socially   Drug use: Never    Review of Systems Constitutional: No fever/chills Eyes: No visual changes. ENT: No sore throat. Cardiovascular: Denies chest pain. Respiratory: Denies shortness of breath. Gastrointestinal: No abdominal pain.  No nausea, no vomiting.  No diarrhea.  No constipation. Genitourinary: Negative for dysuria. Musculoskeletal: Swelling and discomfort to the lateral aspect of the right knee. Integumentary: Negative for rash. Neurological: Negative for headaches, focal weakness or numbness.   ____________________________________________   PHYSICAL EXAM:  VITAL SIGNS: ED Triage Vitals [06/06/21 1842]  Enc Vitals Group     BP (!) 143/101     Pulse Rate (!) 120     Resp 18     Temp 98.3 F (36.8 C)     Temp Source Oral     SpO2 96 %  Weight      Height      Head Circumference      Peak Flow      Pain Score 7     Pain Loc      Pain Edu?      Excl. in GC?     Constitutional: Alert and oriented.  Eyes: Conjunctivae are normal.  Head: Atraumatic. Nose: No congestion/rhinnorhea. Mouth/Throat: Patient is wearing a mask. Neck: No stridor.  No meningeal signs.   Cardiovascular: Normal rate, regular rhythm. Good peripheral circulation. Respiratory: Normal respiratory effort.  No retractions. Gastrointestinal: Soft and nontender. No distention.  Musculoskeletal: Nonerythematous soft and minimally tender swelling to the lateral aspect of the right knee consistent with  bursitis.  No indication of infection, no indication of gout. Neurologic:  Normal speech and language. No gross focal neurologic deficits are appreciated.  Skin:  Skin is warm, dry and intact. Psychiatric: Mood and affect are normal. Speech and behavior are normal.  ____________________________________________    RADIOLOGY I, Loleta Rose, personally viewed and evaluated these images (plain radiographs) as part of my medical decision making, as well as reviewing the written report by the radiologist.  ED MD interpretation: No acute DVT  Official radiology report(s): US Venous Img Lower Unilateral Right  Result Date: 06/06/2021 CLINICAL DATA:  Swelling and pain of the right lung EXAM: RIGHT LOWER EXTREMITY VENOUS DOPPLER ULTRASOUND TECHNIQUE: Gray-scale sonography with compression, as well as color and duplex ultrasound, were performed to evaluate the deep venous system(s) from the level of the common femoral vein through the popliteal and proximal calf veins. COMPARISON:  Doppler ultrasound 06/02/2021 FINDINGS: VENOUS Normal compressibility of the common femoral, superficial femoral, and popliteal veins, as well as the visualized calf veins. Visualized portions of profunda femoral vein and great saphenous vein unremarkable. No filling defects to suggest DVT on grayscale or color Doppler imaging. Doppler waveforms show normal direction of venous flow, normal respiratory plasticity and response to augmentation. Limited views of the contralateral common femoral vein are unremarkable. OTHER Sizable right knee joint effusion noted incidentally. Limitations: none IMPRESSION: No evidence of deep venous thrombosis right extremity. Right knee joint effusion noted in the area of palpable swelling along the lateral knee. Electronically Signed   By: Kreg Shropshire M.D.   On: 06/06/2021 20:17    ____________________________________________   PROCEDURES   Procedure(s) performed (including Critical  Care):  Procedures   ____________________________________________   INITIAL IMPRESSION / MDM / ASSESSMENT AND PLAN / ED COURSE  As part of my medical decision making, I reviewed the following data within the electronic MEDICAL RECORD NUMBER Nursing notes reviewed and incorporated, Old chart reviewed, and Notes from prior ED visits   Differential diagnosis includes, but is not limited to, bursitis, gout, septic arthritis.  Patient is well-appearing.  Normal and stable vitals.  No appreciable neurological deficit on exam.  Patient had a CT scan recently that showed a bulging disc and this is thought to be causing some lumbar radiculopathy.  The acute onset "swelling" to the right side of her knee appears very consistent with a benign bursitis.  Unclear why she developed the bursitis but there is no sign of infection and at this point I feel that aspirating it is more risky given the possibility of introducing infection than any benefit, therapeutic or diagnostic, to aspiration.  She understands and agrees.  Ultrasound shows no sign of DVT.  I talked to her about conservative management and recommended orthopedics follow-up.  I gave my  usual and customary return precautions.           ____________________________________________  FINAL CLINICAL IMPRESSION(S) / ED DIAGNOSES  Final diagnoses:  Bursitis of right knee, unspecified bursa     MEDICATIONS GIVEN DURING THIS VISIT:  Medications - No data to display   ED Discharge Orders     None        Note:  This document was prepared using Dragon voice recognition software and may include unintentional dictation errors.   Loleta Rose, MD 06/07/21 [phone]

## 2021-07-16 NOTE — ED Provider Notes (Signed)
[facility] Emergency Department Provider Note ____________________________________________   Event Date/Time   First MD Initiated Contact with Patient 06/02/21 1454     (approximate)  I have reviewed the triage vital signs and the nursing notes.   HISTORY  Chief Complaint Leg Pain  HPI Jessica Mcfarland is a 30 y.o. female with no significant past medical history presents to the emergency department for treatment and evaluation of right calf cramping since Saturday with right sided foot numbness since yesterday.         History reviewed. No pertinent past medical history.  There are no problems to display for this patient.   Past Surgical History:  Procedure Laterality Date   NO PAST SURGERIES      Prior to Admission medications   Medication Sig Start Date End Date Taking? Authorizing Provider  predniSONE (STERAPRED UNI-PAK 21 TAB) 10 MG (21) TBPK tablet Take 6 tablets the first day, take 5 tablets the second day, take 4 tablets the third day, take 3 tablets the fourth day, take 2 tablets the fifth day, take 1 tablet the sixth day. 06/02/21  Yes Pia Mau M, PA-C  dicyclomine (BENTYL) 20 MG tablet Take 1 tablet (20 mg total) by mouth 2 (two) times daily. 11/11/20   Becky Augusta, NP    Allergies Patient has no known allergies.  Family History  Problem Relation Age of Onset   Arthritis Mother    Healthy Father     Social History Social History   Tobacco Use   Smoking status: Never   Smokeless tobacco: Never  Vaping Use   Vaping Use: Never used  Substance Use Topics   Alcohol use: Yes    Comment: socially   Drug use: Never    Review of Systems  Constitutional: No fever/chills Eyes: No visual changes. ENT: No sore throat. Cardiovascular: Denies chest pain. Respiratory: Denies shortness of breath. Gastrointestinal: No abdominal pain.  No nausea, no vomiting.  No diarrhea.  No constipation. Genitourinary: Negative  for dysuria. Musculoskeletal: Negative for back pain. Positive for right calf and foot pain. Skin: Negative for rash. Neurological: Negative for headaches, focal weakness or numbness. ____________________________________________   PHYSICAL EXAM:  VITAL SIGNS: ED Triage Vitals  Enc Vitals Group     BP 06/02/21 1402 (!) 147/82     Pulse Rate 06/02/21 1402 73     Resp 06/02/21 1402 16     Temp 06/02/21 1402 98.8 F (37.1 C)     Temp Source 06/02/21 1402 Oral     SpO2 06/02/21 1402 100 %     Weight 06/02/21 1403 (!) 309 lb 15.5 oz (140.6 kg)     Height 06/02/21 1403 5\' 9"  (1.753 m)     Head Circumference --      Peak Flow --      Pain Score 06/02/21 1403 0     Pain Loc --      Pain Edu? --      Excl. in GC? --     Constitutional: Alert and oriented. Well appearing and in no acute distress. Eyes: Conjunctivae are normal. Head: Atraumatic. Nose: No congestion/rhinnorhea. Mouth/Throat: Mucous membranes are moist.  Oropharynx non-erythematous. Neck: No stridor.   Hematological/Lymphatic/Immunilogical: No cervical lymphadenopathy. Cardiovascular: Normal rate, regular rhythm. Grossly normal heart sounds.  Good peripheral circulation. Respiratory: Normal respiratory effort.  No retractions. Lungs CTAB. Gastrointestinal: Soft and nontender. No distention. No abdominal bruits. Genitourinary:  Musculoskeletal: No lower extremity tenderness nor edema.  No  joint effusions. Neurologic:  Normal speech and language. No gross focal neurologic deficits are appreciated. No gait instability. Skin:  Skin is warm, dry and intact. No rash noted. Psychiatric: Mood and affect are normal. Speech and behavior are normal.  ____________________________________________   LABS (all labs ordered are listed, but only abnormal results are displayed)  Labs Reviewed  CBC WITH DIFFERENTIAL/PLATELET - Abnormal; Notable for the following components:      Result Value   WBC 11.3 (*)    All other components  within normal limits  COMPREHENSIVE METABOLIC PANEL  CK   ____________________________________________  EKG  Not indicated. ____________________________________________  RADIOLOGY  ED MD interpretation:    Venous US of the right lower extremity negative for DVT.  I, Kem Boroughs, personally viewed and evaluated these images (plain radiographs) as part of my medical decision making, as well as reviewing the written report by the radiologist.  Official radiology report(s): No results found.  ____________________________________________   PROCEDURES  Procedure(s) performed (including Critical Care):  Procedures  ____________________________________________   INITIAL IMPRESSION / ASSESSMENT AND PLAN     30 year old female presents to the emergency department for right lower extremity pain. See HPI.  DIFFERENTIAL DIAGNOSIS  DVT, rhabdomyolysis, musculoskeletal pain.  ED COURSE  Awaiting results. Care relinquished to J. Joseph Art, PA-C who will follow to disposition.    ___________________________________________   FINAL CLINICAL IMPRESSION(S) / ED DIAGNOSES  Final diagnoses:  Right calf pain  Right foot pain     ED Discharge Orders          Ordered    predniSONE (STERAPRED UNI-PAK 21 TAB) 10 MG (21) TBPK tablet        06/02/21 1659    methocarbamol (ROBAXIN) 500 MG tablet  Every 8 hours PRN        06/02/21 1659             Jessica Mcfarland was evaluated in Emergency Department on 07/16/2021 for the symptoms described in the history of present illness. She was evaluated in the context of the global COVID-19 pandemic, which necessitated consideration that the patient might be at risk for infection with the SARS-CoV-2 virus that causes COVID-19. Institutional protocols and algorithms that pertain to the evaluation of patients at risk for COVID-19 are in a state of rapid change based on information released by regulatory bodies including the CDC  and federal and state organizations. These policies and algorithms were followed during the patient's care in the ED.   Note:  This document was prepared using Dragon voice recognition software and may include unintentional dictation errors.    Chinita Pester, FNP 07/16/21 1900    Chesley Noon, MD 07/21/21 [phone]

## 2021-10-13 ENCOUNTER — Other Ambulatory Visit: Payer: Self-pay

## 2021-10-13 ENCOUNTER — Ambulatory Visit
Admission: RE | Admit: 2021-10-13 | Discharge: 2021-10-13 | Disposition: A | Discharge status: Home / Self Care | Source: Ambulatory Visit | Attending: Emergency Medicine | Admitting: Emergency Medicine

## 2021-10-13 VITALS — BP 119/82 | HR 78 | Temp 98.0°F | Resp 18

## 2021-10-13 DIAGNOSIS — M5442 Lumbago with sciatica, left side: Secondary | ICD-10-CM

## 2021-10-13 LAB — POCT URINALYSIS DIP (MANUAL ENTRY)
Bilirubin, UA: NEGATIVE
Blood, UA: NEGATIVE
Glucose, UA: NEGATIVE mg/dL
Leukocytes, UA: NEGATIVE
Nitrite, UA: NEGATIVE
Protein Ur, POC: NEGATIVE mg/dL
Spec Grav, UA: >=1.03 — AB (ref 1.010–1.025)
Urobilinogen, UA: 0.2 E.U./dL
pH, UA: 5 (ref 5.0–8.0)

## 2021-10-13 LAB — POCT URINE PREGNANCY: Preg Test, Ur: NEGATIVE

## 2021-10-13 MED ORDER — CYCLOBENZAPRINE HCL 10 MG PO TABS: 10.0000 mg | ORAL_TABLET | Freq: Two times a day (BID) | ORAL | 0 refills | Status: DC | PRN

## 2021-10-13 NOTE — Discharge Instructions (Addendum)
Continue taking the meloxicam as prescribed.  Stop taking the methocarbamol.  Instead take the cyclobenzaprine as needed for muscle spasm; Do not drive, operate machinery, or drink alcohol with this medication as it can cause drowsiness.   Follow up with your primary care provider or an orthopedist if your symptoms are not improving.

## 2021-10-13 NOTE — ED Triage Notes (Signed)
Patient presents to Urgent Care with complaints of lower back pain x 2 days. She believes it was caused by sitting incorrectly in a small vehicle. Treating symptoms with mobic, robaxin, and heat compresses with relief. She states she is here for eval since bending causes a sharp pain.   Denies any urinary symptoms.

## 2021-10-13 NOTE — ED Provider Notes (Signed)
Roderic Palau    CSN: [id number] Arrival date & time: 10/13/21  [id number]      History   Chief Complaint Chief Complaint  Patient presents with   Back Pain    X 2 days     HPI Jessica Mcfarland is a 30 y.o. female.  Patient presents with bilateral low back pain x2 days.  No falls or injury.  Her pain started after riding in an uncomfortable car.  The pain is worse with changing positions and improves with standing.  Occasionally radiates down her left leg.  She denies numbness, weakness, saddle anesthesia, loss of bowel/bladder control, abdominal pain, dysuria, hematuria, or other symptoms.  Treatment at home with meloxicam and methocarbamol.  She reports an episode of similar back pain in January 2022; these medications were prescribed at that time and helped.  The history is provided by the patient.   History reviewed. No pertinent past medical history.  There are no problems to display for this patient.   Past Surgical History:  Procedure Laterality Date   NO PAST SURGERIES      OB History   No obstetric history on file.      Home Medications    Prior to Admission medications   Medication Sig Start Date End Date Taking? Authorizing Provider  cyclobenzaprine (FLEXERIL) 10 MG tablet Take 1 tablet (10 mg total) by mouth 2 (two) times daily as needed for muscle spasms. 10/13/21  Yes Sharion Balloon, NP  dicyclomine (BENTYL) 20 MG tablet Take 1 tablet (20 mg total) by mouth 2 (two) times daily. 11/11/20   Margarette Canada, NP  meloxicam (MOBIC) 15 MG tablet Take 15 mg by mouth daily. 06/18/21   [provider]  predniSONE (STERAPRED UNI-PAK 21 TAB) 10 MG (21) TBPK tablet Take 6 tablets the first day, take 5 tablets the second day, take 4 tablets the third day, take 3 tablets the fourth day, take 2 tablets the fifth day, take 1 tablet the sixth day. 06/02/21   Lannie Fields, PA-C    Family History Family History  Problem Relation Age of Onset    Arthritis Mother    Healthy Father     Social History Social History   Tobacco Use   Smoking status: Never   Smokeless tobacco: Never  Vaping Use   Vaping Use: Never used  Substance Use Topics   Alcohol use: Yes    Comment: socially   Drug use: Never     Allergies   Patient has no known allergies.   Review of Systems Review of Systems  Constitutional:  Negative for chills and fever.  Respiratory:  Negative for cough and shortness of breath.   Cardiovascular:  Negative for chest pain and palpitations.  Gastrointestinal:  Negative for abdominal pain and vomiting.  Genitourinary:  Negative for dysuria and hematuria.  Musculoskeletal:  Positive for back pain. Negative for gait problem.  Skin:  Negative for color change and rash.  Neurological:  Negative for weakness and numbness.  All other systems reviewed and are negative.   Physical Exam Triage Vital Signs ED Triage Vitals  Enc Vitals Group     BP      Pulse      Resp      Temp      Temp src      SpO2      Weight      Height      Head Circumference  Peak Flow      Pain Score      Pain Loc      Pain Edu?      Excl. in GC?    No data found.  Updated Vital Signs BP 119/82 (BP Location: Left Arm)   Pulse 78   Temp 98 F (36.7 C) (Oral)   Resp 18   LMP 10/03/2021   SpO2 98%   Visual Acuity Right Eye Distance:   Left Eye Distance:   Bilateral Distance:    Right Eye Near:   Left Eye Near:    Bilateral Near:     Physical Exam Vitals and nursing note reviewed.  Constitutional:      General: She is not in acute distress.    Appearance: She is well-developed. She is not ill-appearing.  HENT:     Head: Normocephalic and atraumatic.     Mouth/Throat:     Mouth: Mucous membranes are moist.  Eyes:     Conjunctiva/sclera: Conjunctivae normal.  Cardiovascular:     Rate and Rhythm: Normal rate and regular rhythm.     Heart sounds: Normal heart sounds.  Pulmonary:     Effort: Pulmonary effort  is normal. No respiratory distress.     Breath sounds: Normal breath sounds.  Abdominal:     Palpations: Abdomen is soft.     Tenderness: There is no abdominal tenderness. There is no right CVA tenderness, left CVA tenderness, guarding or rebound.  Musculoskeletal:        General: No swelling, tenderness, deformity or signs of injury. Normal range of motion.     Cervical back: Neck supple.  Skin:    General: Skin is warm and dry.     Capillary Refill: Capillary refill takes less than 2 seconds.     Findings: No bruising, erythema, lesion or rash.  Neurological:     General: No focal deficit present.     Mental Status: She is alert and oriented to person, place, and time.     Sensory: No sensory deficit.     Motor: No weakness.     Gait: Gait normal.  Psychiatric:        Mood and Affect: Mood normal.        Behavior: Behavior normal.     UC Treatments / Results  Labs (all labs ordered are listed, but only abnormal results are displayed) Labs Reviewed  POCT URINALYSIS DIP (MANUAL ENTRY) - Abnormal; Notable for the following components:      Result Value   Ketones, POC UA trace (5) (*)    Spec Grav, UA >=1.030 (*)    All other components within normal limits  POCT URINE PREGNANCY    EKG   Radiology No results found.  Procedures Procedures (including critical care time)  Medications Ordered in UC Medications - No data to display  Initial Impression / Assessment and Plan / UC Course  I have reviewed the triage vital signs and the nursing notes.  Pertinent labs & imaging results that were available during my care of the patient were reviewed by me and considered in my medical decision making (see chart for details).  Low back pain with left-sided sciatica.  Instructed patient to continue taking meloxicam.  Discontinuing methocarbamol and starting cyclobenzaprine instead.  Precautions for drowsiness with cyclobenzaprine discussed.  Instructed patient to follow-up with  her PCP or an orthopedist if her symptoms are not improving.  She agrees to plan of care.   Final Clinical Impressions(s) /  UC Diagnoses   Final diagnoses:  Acute bilateral low back pain with left-sided sciatica     Discharge Instructions      Continue taking the meloxicam as prescribed.  Stop taking the methocarbamol.  Instead take the cyclobenzaprine as needed for muscle spasm; Do not drive, operate machinery, or drink alcohol with this medication as it can cause drowsiness.   Follow up with your primary care provider or an orthopedist if your symptoms are not improving.         ED Prescriptions     Medication Sig Dispense Auth. Provider   cyclobenzaprine (FLEXERIL) 10 MG tablet Take 1 tablet (10 mg total) by mouth 2 (two) times daily as needed for muscle spasms. 20 tablet Sharion Balloon, NP      PDMP not reviewed this encounter.   Sharion Balloon, NP 10/13/21 [phone]

## 2021-10-22 ENCOUNTER — Emergency Department (HOSPITAL_COMMUNITY)
Admission: EM | Admit: 2021-10-22 | Discharge: 2021-10-22 | Disposition: A | Discharge status: Home / Self Care | Attending: Emergency Medicine | Admitting: Emergency Medicine

## 2021-10-22 ENCOUNTER — Other Ambulatory Visit: Payer: Self-pay

## 2021-10-22 ENCOUNTER — Encounter (HOSPITAL_COMMUNITY): Payer: Self-pay

## 2021-10-22 DIAGNOSIS — M5431 Sciatica, right side: Secondary | ICD-10-CM | POA: Insufficient documentation

## 2021-10-22 MED ORDER — METHYLPREDNISOLONE 4 MG PO TBPK: ORAL_TABLET | ORAL | 0 refills | Status: DC

## 2021-10-22 NOTE — ED Provider Notes (Signed)
[facility] EMERGENCY DEPARTMENT Provider Note   CSN: [id number] Arrival date & time: 10/22/21  0836     History Chief Complaint  Patient presents with   Sciatica    Jessica Mcfarland is a 30 y.o. female.     The history is provided by the patient.  Illness Location:  Right lower back/gluteal area Severity:  Mild Onset quality:  Gradual Duration:  2 days Timing:  Intermittent Progression:  Waxing and waning Chronicity:  New Context:  Pain in the right buttocks shooting into the thigh Relieved by:  Rest Worsened by:  Standing, walking Associated symptoms: no abdominal pain, no chest pain, no congestion, no cough, no ear pain, no fatigue, no myalgias, no shortness of breath and no vomiting       History reviewed. No pertinent past medical history.  There are no problems to display for this patient.   Past Surgical History:  Procedure Laterality Date   NO PAST SURGERIES       OB History   No obstetric history on file.     Family History  Problem Relation Age of Onset   Arthritis Mother    Healthy Father     Social History   Tobacco Use   Smoking status: Never   Smokeless tobacco: Never  Vaping Use   Vaping Use: Never used  Substance Use Topics   Alcohol use: Yes    Comment: socially   Drug use: Never    Home Medications Prior to Admission medications   Medication Sig Start Date End Date Taking? Authorizing Provider  methylPREDNISolone (MEDROL DOSEPAK) 4 MG TBPK tablet Follow package insert 10/22/21  Yes Jayquan Bradsher, DO  cyclobenzaprine (FLEXERIL) 10 MG tablet Take 1 tablet (10 mg total) by mouth 2 (two) times daily as needed for muscle spasms. 10/13/21   Mickie Bail, NP  dicyclomine (BENTYL) 20 MG tablet Take 1 tablet (20 mg total) by mouth 2 (two) times daily. 11/11/20   Becky Augusta, NP  meloxicam (MOBIC) 15 MG tablet Take 15 mg by mouth daily. 06/18/21   [provider]    Allergies    Patient has no  known allergies.  Review of Systems   Review of Systems  Constitutional:  Negative for fatigue.  HENT:  Negative for congestion and ear pain.   Respiratory:  Negative for cough and shortness of breath.   Cardiovascular:  Negative for chest pain.  Gastrointestinal:  Negative for abdominal pain and vomiting.  Musculoskeletal:  Positive for gait problem. Negative for arthralgias, back pain, joint swelling, myalgias, neck pain and neck stiffness.  Skin:  Negative for wound.  Neurological:  Negative for weakness and numbness.   Physical Exam Updated Vital Signs  ED Triage Vitals  Enc Vitals Group     BP 10/22/21 0851 (!) 169/97     Pulse Rate 10/22/21 0851 97     Resp 10/22/21 0853 18     Temp 10/22/21 0853 98.3 F (36.8 C)     Temp Source 10/22/21 0853 Oral     SpO2 10/22/21 0851 100 %     Weight 10/22/21 0854 (!) 319 lb (144.7 kg)     Height 10/22/21 0854 5\' 9"  (1.753 m)     Head Circumference --      Peak Flow --      Pain Score 10/22/21 0852 10     Pain Loc --      Pain Edu? --      Excl.  in GC? --      Physical Exam Constitutional:      General: She is not in acute distress.    Appearance: She is not ill-appearing.  HENT:     Head: Normocephalic and atraumatic.     Nose: Nose normal.     Mouth/Throat:     Mouth: Mucous membranes are moist.  Cardiovascular:     Pulses: Normal pulses.     Heart sounds: Normal heart sounds.  Pulmonary:     Effort: Pulmonary effort is normal.  Abdominal:     General: Abdomen is flat.     Tenderness: There is no abdominal tenderness.  Musculoskeletal:        General: Tenderness (TTP to right gluteal area, no midline spinal pain or lowerback tenderness) present.  Skin:    Capillary Refill: Capillary refill takes less than 2 seconds.  Neurological:     General: No focal deficit present.     Mental Status: She is alert.     Sensory: No sensory deficit.     Motor: No weakness.     Comments: 5+ out of 5 strength throughout, normal  sensation    ED Results / Procedures / Treatments   Labs (all labs ordered are listed, but only abnormal results are displayed) Labs Reviewed - No data to display  EKG None  Radiology No results found.  Procedures Procedures   Medications Ordered in ED Medications - No data to display  ED Course  I have reviewed the triage vital signs and the nursing notes.  Pertinent labs & imaging results that were available during my care of the patient were reviewed by me and considered in my medical decision making (see chart for details).    MDM Rules/Calculators/A&P                           Brynne Doane is here with pain in her right gluteal area.  Symptoms consistent with sciatica.  No symptoms suggestive of cauda equina.  No loss of bowel or bladder.  Neurological exam is normal.  Normal strength and sensation in her lower legs.  Normal vitals.  No fever.  Good pulses in her lower extremities.  No concern for DVT or peripheral arterial disease.  No midline spinal pain.  No trauma history.  Recent menstrual cycle no concern for pregnancy.  Overall we will prescribe Medrol Dosepak.  Recommend continued use of meloxicam.  We will have her follow-up with sports medicine.  Discharged in the ED in good condition.  Understands return precautions.  This chart was dictated using voice recognition software.  Despite best efforts to proofread,  errors can occur which can change the documentation meaning.  Final Clinical Impression(s) / ED Diagnoses Final diagnoses:  Sciatica of right side    Rx / DC Orders ED Discharge Orders          Ordered    methylPREDNISolone (MEDROL DOSEPAK) 4 MG TBPK tablet        10/22/21 0921             Virgina Norfolk, DO 10/22/21 [id number]

## 2021-10-22 NOTE — ED Triage Notes (Addendum)
Pt c/o pain going down R back/buttocks into leg that started last night; denies injury; pt seen last Monday for similar pain on L side, given muscle relaxer; denies urinary symptoms; worse with movement; pt states "it feels like a pull"

## 2021-10-22 NOTE — ED Notes (Signed)
Pt verbalized understanding of discharge instructions; opportunity for questions provided °

## 2021-10-24 ENCOUNTER — Other Ambulatory Visit: Payer: Self-pay

## 2021-10-24 ENCOUNTER — Emergency Department
Admission: EM | Admit: 2021-10-24 | Discharge: 2021-10-24 | Disposition: A | Discharge status: Home / Self Care | Attending: Emergency Medicine | Admitting: Emergency Medicine

## 2021-10-24 ENCOUNTER — Emergency Department

## 2021-10-24 DIAGNOSIS — M545 Low back pain, unspecified: Secondary | ICD-10-CM | POA: Diagnosis present

## 2021-10-24 DIAGNOSIS — M5116 Intervertebral disc disorders with radiculopathy, lumbar region: Secondary | ICD-10-CM | POA: Diagnosis not present

## 2021-10-24 DIAGNOSIS — M5441 Lumbago with sciatica, right side: Secondary | ICD-10-CM

## 2021-10-24 MED ORDER — LORAZEPAM 2 MG/ML IJ SOLN
1.0000 mg | Freq: Once | INTRAMUSCULAR | Status: AC
  Administered 2021-10-24: 1 mg via INTRAVENOUS
  Filled 2021-10-24: qty 1

## 2021-10-24 NOTE — ED Notes (Signed)
Patient stable and discharged with all personal belongings and AVS. AVS and discharge instructions reviewed with patient and opportunity for questions provided.   

## 2021-10-24 NOTE — ED Notes (Signed)
Pt arrives from home today with R hip pain, pt was seen at other ER in [place] and was told it is sciatica. Pt arrived today in visible discomfort, endorsing that the pain is progressing and is shooting down her R leg.

## 2021-10-24 NOTE — Discharge Instructions (Signed)
Continue taking tapered steroid. Please make follow-up appointment with Dr. Adriana Simas. You can take muscle relaxer at night before bed.

## 2021-10-24 NOTE — ED Triage Notes (Signed)
Pt to ED for right sided back pain radiating down right leg for one week. States was seen at ER and told sciatic pain and given meds. Reports no improvement. Ambulatory Denies injury

## 2021-10-24 NOTE — ED Provider Notes (Signed)
[facility]-EMERGENCY DEPARTMENT  ____________________________________________  Time seen: Approximately 5:15 PM  I have reviewed the triage vital signs and the nursing notes.   HISTORY  Chief Complaint Back Pain   Historian Patient    HPI Jessica Mcfarland is a 30 y.o. female presents to the emergency department with right-sided low back pain that radiates down the posterior aspect of the lower extremity.  This is the third time that patient has sought care for radiculopathy in the past 3 weeks.  She states that she started off with meloxicam and then was transitioned to prednisone.  She states that she is taken prednisone for the past 3 days and she feels as though her symptoms are worsening and that it is increasingly difficult to stand, ambulate and make it to the restroom.  She denies incontinence or saddle anesthesia. No similar symptoms in the past.    History reviewed. No pertinent past medical history.   Immunizations up to date:  Yes.     History reviewed. No pertinent past medical history.  There are no problems to display for this patient.   Past Surgical History:  Procedure Laterality Date   NO PAST SURGERIES      Prior to Admission medications   Medication Sig Start Date End Date Taking? Authorizing Provider  cyclobenzaprine (FLEXERIL) 10 MG tablet Take 1 tablet (10 mg total) by mouth 2 (two) times daily as needed for muscle spasms. 10/13/21   Mickie Bail, NP  dicyclomine (BENTYL) 20 MG tablet Take 1 tablet (20 mg total) by mouth 2 (two) times daily. 11/11/20   Becky Augusta, NP  meloxicam (MOBIC) 15 MG tablet Take 15 mg by mouth daily. 06/18/21   [provider]  methylPREDNISolone (MEDROL DOSEPAK) 4 MG TBPK tablet Follow package insert 10/22/21   Virgina Norfolk, DO    Allergies Patient has no known allergies.  Family History  Problem Relation Age of Onset   Arthritis Mother    Healthy Father     Social History Social History    Tobacco Use   Smoking status: Never   Smokeless tobacco: Never  Vaping Use   Vaping Use: Never used  Substance Use Topics   Alcohol use: Yes    Comment: socially   Drug use: Never     Review of Systems  Constitutional: No fever/chills Eyes:  No discharge ENT: No upper respiratory complaints. Respiratory: no cough. No SOB/ use of accessory muscles to breath Gastrointestinal:   No nausea, no vomiting.  No diarrhea.  No constipation. Musculoskeletal: Patient has low back pain.  Skin: Negative for rash, abrasions, lacerations, ecchymosis.    ____________________________________________   PHYSICAL EXAM:  VITAL SIGNS: ED Triage Vitals  Enc Vitals Group     BP 10/24/21 1541 139/89     Pulse Rate 10/24/21 1541 100     Resp 10/24/21 1541 20     Temp 10/24/21 1541 98.3 F (36.8 C)     Temp Source 10/24/21 1541 Oral     SpO2 10/24/21 1541 97 %     Weight 10/24/21 1540 (!) 319 lb (144.7 kg)     Height 10/24/21 1540 5\' 9"  (1.753 m)     Head Circumference --      Peak Flow --      Pain Score 10/24/21 1540 10     Pain Loc --      Pain Edu? --      Excl. in GC? --      Constitutional: Alert and oriented.  Well appearing and in no acute distress. Eyes: Conjunctivae are normal. PERRL. EOMI. Head: Atraumatic. ENT:      Nose: No congestion/rhinnorhea.      Mouth/Throat: Mucous membranes are moist.  Neck: No stridor.  No cervical spine tenderness to palpation. Cardiovascular: Normal rate, regular rhythm. Normal S1 and S2.  Good peripheral circulation. Respiratory: Normal respiratory effort without tachypnea or retractions. Lungs CTAB. Good air entry to the bases with no decreased or absent breath sounds Gastrointestinal: Bowel sounds x 4 quadrants. Soft and nontender to palpation. No guarding or rigidity. No distention. Musculoskeletal: Full range of motion to all extremities. No obvious deformities noted Neurologic:  Normal for age. No gross focal neurologic deficits are  appreciated.  Skin:  Skin is warm, dry and intact. No rash noted. Psychiatric: Mood and affect are normal for age. Speech and behavior are normal.   ____________________________________________   LABS (all labs ordered are listed, but only abnormal results are displayed)  Labs Reviewed - No data to display ____________________________________________  EKG   ____________________________________________  RADIOLOGY Geraldo Pitter, personally viewed and evaluated these images (plain radiographs) as part of my medical decision making, as well as reviewing the written report by the radiologist.    MR LUMBAR SPINE WO CONTRAST  Result Date: 10/24/2021 CLINICAL DATA:  Initial evaluation for acute low back pain with extension into the right lower extremity. EXAM: MRI LUMBAR SPINE WITHOUT CONTRAST TECHNIQUE: Multiplanar, multisequence MR imaging of the lumbar spine was performed. No intravenous contrast was administered. COMPARISON:  None available. FINDINGS: Segmentation: Standard. Lowest well-formed disc space labeled the L5-S1 level. Alignment: Physiologic with preservation of the normal lumbar lordosis. No listhesis. Vertebrae: Vertebral body height maintained without acute or chronic fracture. Bone marrow signal intensity within normal limits. No discrete or worrisome osseous lesions. Minimal reactive endplate change present about the L4-5 and L5-S1 interspaces. No other abnormal marrow edema. Conus medullaris and cauda equina: Conus extends to the L1 level. Conus and cauda equina appear normal. Paraspinal and other soft tissues: Unremarkable. Disc levels: No significant findings are seen through the L3-4 level. L4-5: Disc desiccation with mild disc bulge. Superimposed shallow central to left subarticular disc protrusion mildly flattens the left ventral thecal sac. Associated annular fissure. No significant canal or lateral recess stenosis. Foramina remain patent. L5-S1: Disc desiccation with  mild disc bulge. Superimposed right subarticular disc extrusion with inferior migration (series 9, image 8). Disc material impinges upon the descending right S1 nerve root as it courses through the right lateral recess. Moderate right lateral recess stenosis. Central canal remains widely patent. No foraminal stenosis. IMPRESSION: 1. 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 2. Shallow central to left subarticular disc protrusion at L4-5 without significant stenosis or neural impingement. Electronically Signed   By: Rise Mu M.D.   On: 10/24/2021 18:57    ____________________________________________    PROCEDURES  Procedure(s) performed:     Procedures     Medications  LORazepam (ATIVAN) injection 1 mg (1 mg Intravenous Given 10/24/21 1730)     ____________________________________________   INITIAL IMPRESSION / ASSESSMENT AND PLAN / ED COURSE  Pertinent labs & imaging results that were available during my care of the patient were reviewed by me and considered in my medical decision making (see chart for details).      Assessment and Plan:  Low back pain:  Presents to the emergency department  with low back pain, difficulty walking and progressively worsening numbness and tingling of the right lower extremity.  Vital signs are reassuring at triage.  MRI of the lumbar spine showed disc extrusion at L4-L5 and L5-S1.   Patient was given a follow-up referral to neurosurgery, Dr. Adriana Simas.  Recommended continuing prednisone as directed.  Also recommended taking muscle relaxer already prescribed patient at night before bed.    ____________________________________________  FINAL CLINICAL IMPRESSION(S) / ED DIAGNOSES  Final diagnoses:  Acute bilateral low back pain with right-sided sciatica      NEW MEDICATIONS STARTED DURING THIS VISIT:  ED  Discharge Orders     None           This chart was dictated using voice recognition software/Dragon. Despite best efforts to proofread, errors can occur which can change the meaning. Any change was purely unintentional.     Orvil Feil, PA-C 10/24/21 1934    Sharman Cheek, MD 10/26/21 [phone]

## 2021-10-29 ENCOUNTER — Ambulatory Visit: Admitting: Family Medicine

## 2022-06-13 IMAGING — US US EXTREM LOW VENOUS*R*
1 series · 13 of 24 positions shown · non-contrast
Comparison: None.

CLINICAL DATA: 29-year-old female with right calf pain for 3 days.

EXAM:
RIGHT LOWER EXTREMITY VENOUS DOPPLER ULTRASOUND
TECHNIQUE: Gray-scale sonography with graded compression, as well as color
Doppler and duplex ultrasound were performed to evaluate the right
lower extremity deep venous systems from the level of the common
femoral vein and including the common femoral, femoral, profunda
femoral, popliteal and calf veins including the posterior tibial,
peroneal and gastrocnemius veins when visible. Spectral Doppler was
utilized to evaluate flow at rest and with distal augmentation
maneuvers in the common femoral, femoral and popliteal veins. The
contralateral common femoral vein was also evaluated for comparison.

[Series 1: us venous img lower uni right (dvt) · portal-venous · 13 of 33 slices shown]
[im 1/33]
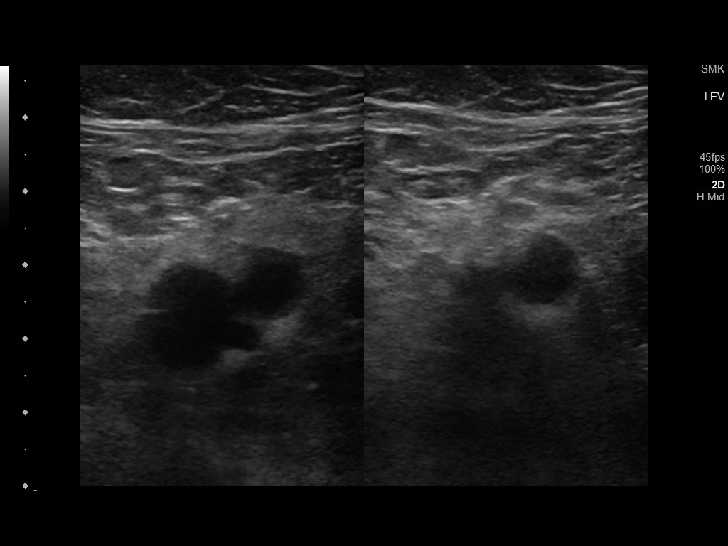
[im 3/33]
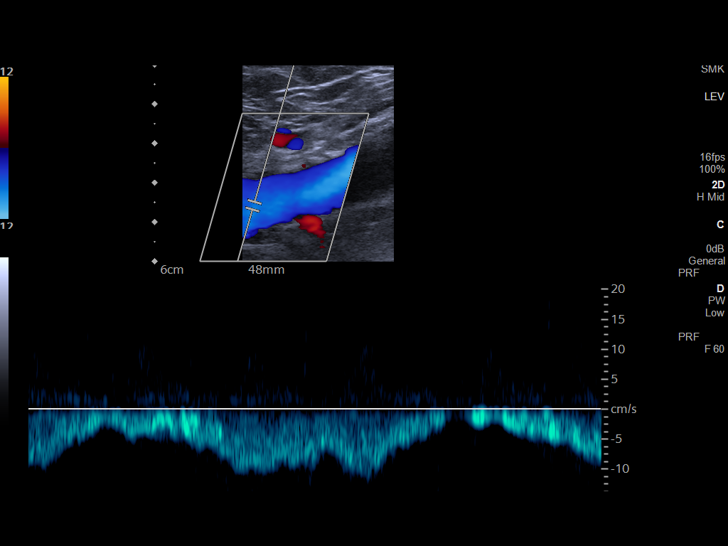
[im 6/33]
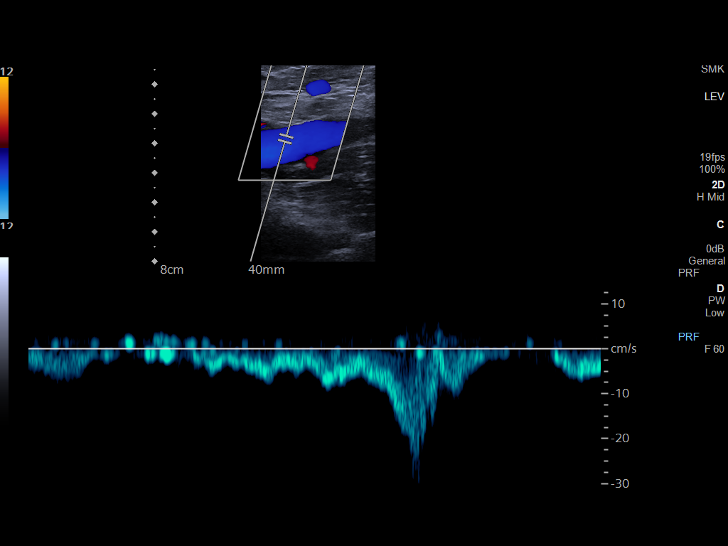
[im 9/33]
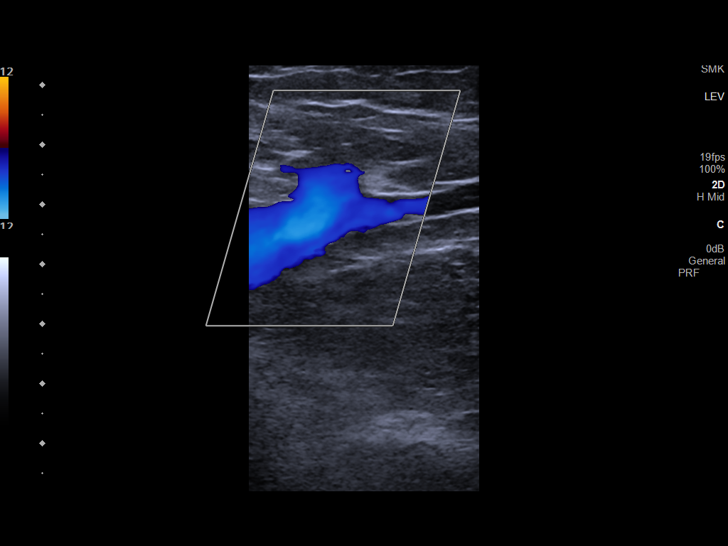
[im 12/33]
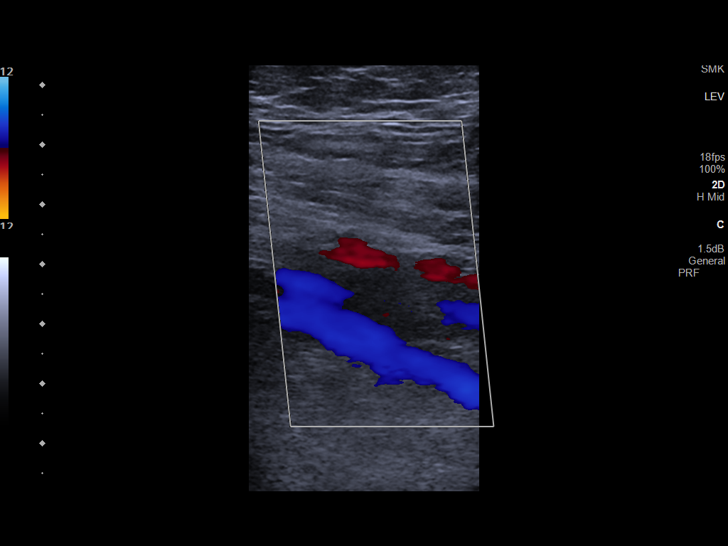
[im 14/33]
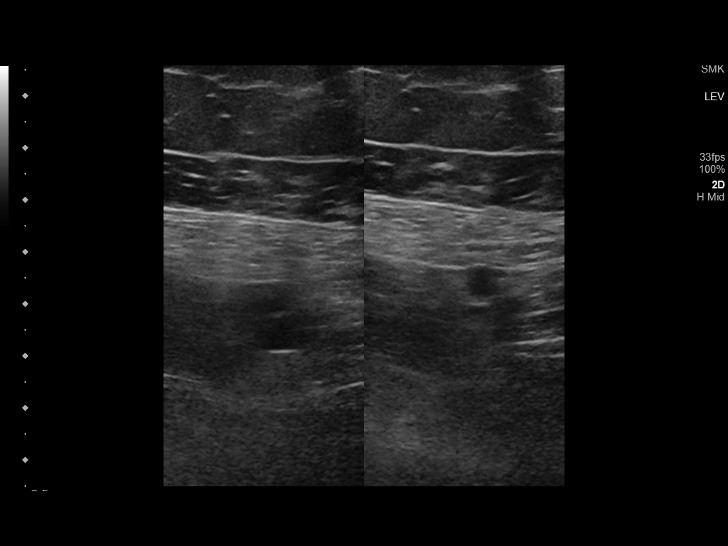
[im 17/33]
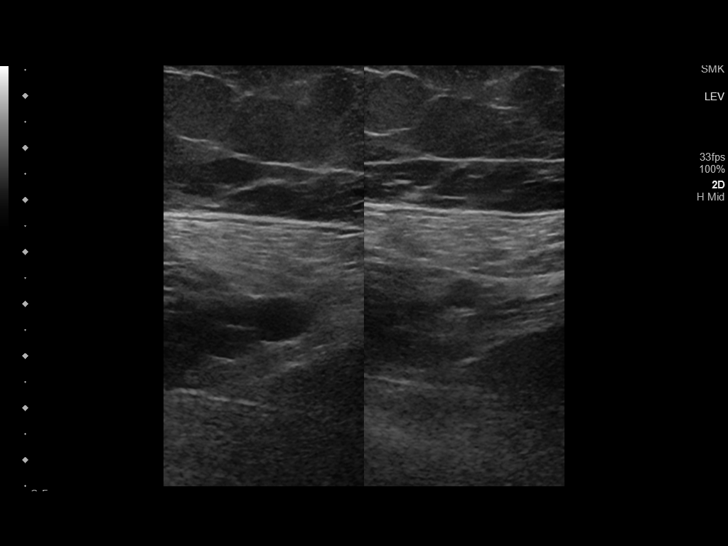
[im 19/33]
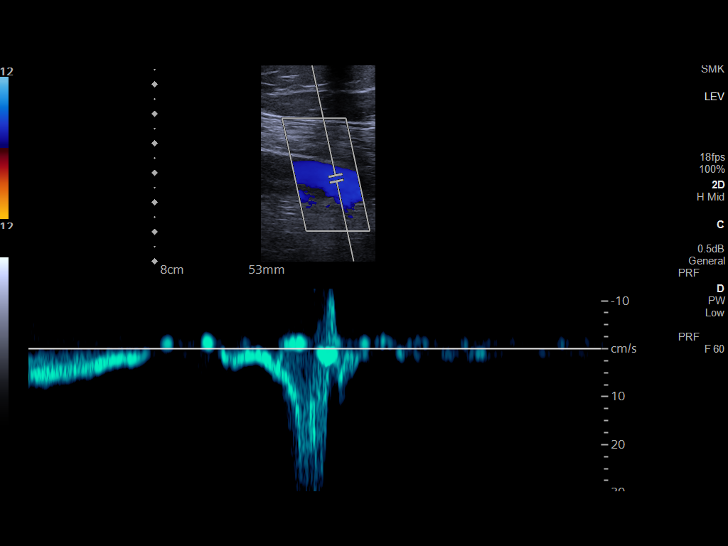
[im 21/33]
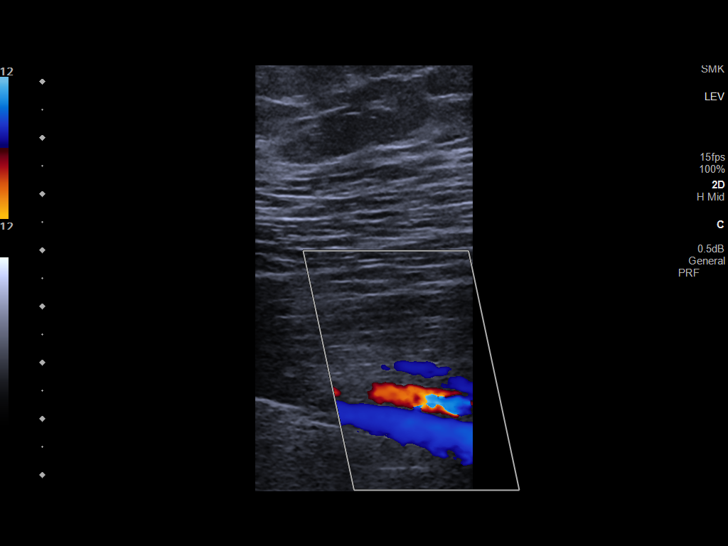
[im 24/33]
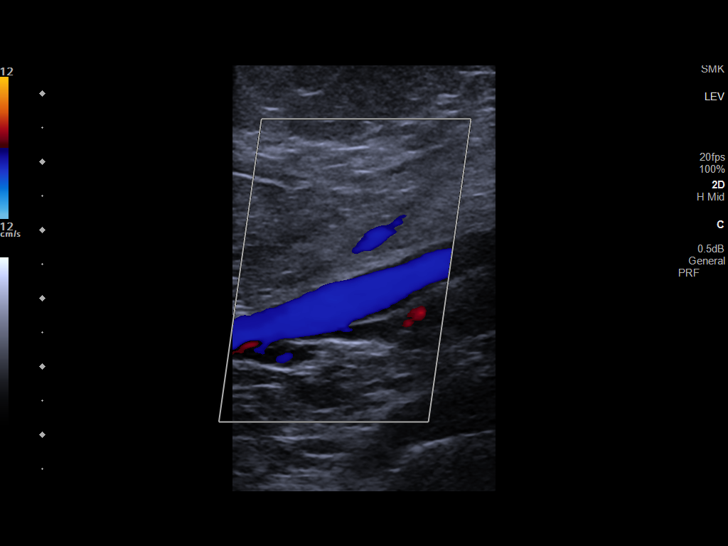
[im 27/33]
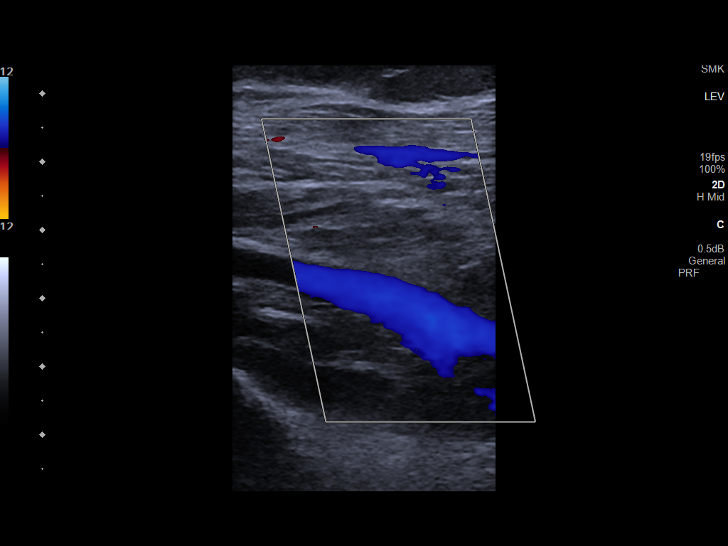
[im 30/33]
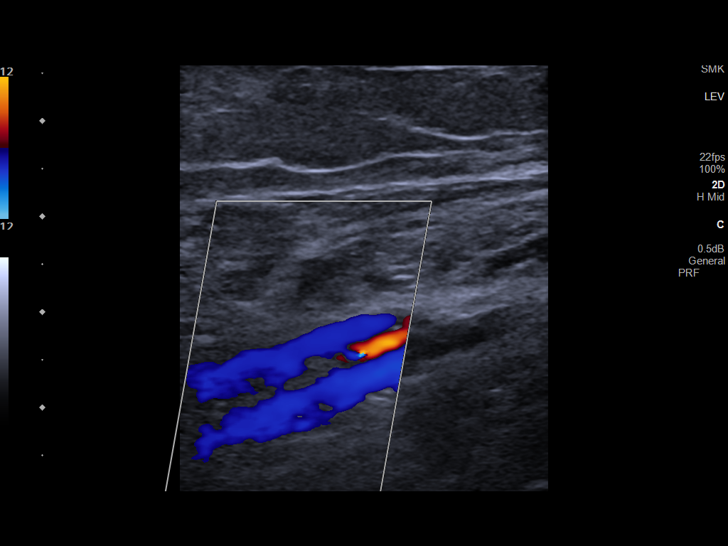
[im 33/33]
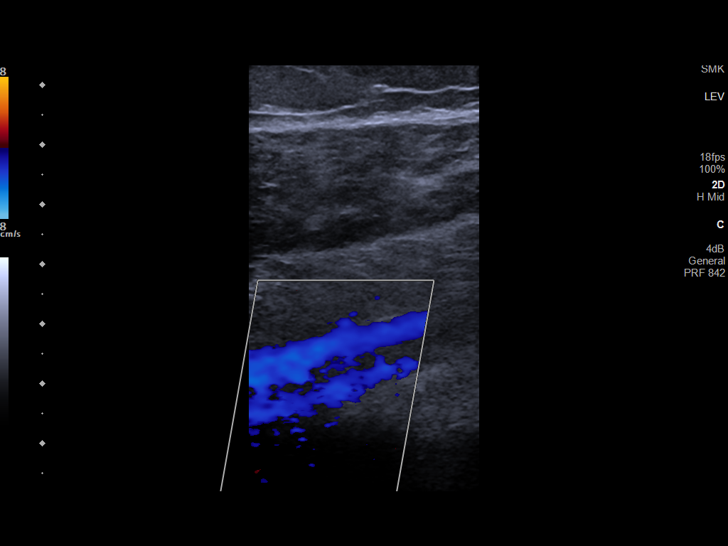

[13 of 24 positions shown; findings below may reference images not displayed]

FINDINGS: RIGHT LOWER EXTREMITY

Common Femoral Vein: No evidence of thrombus. Normal
compressibility, respiratory phasicity and response to augmentation.

Central Greater Saphenous Vein: No evidence of thrombus. Normal
compressibility and flow on color Doppler imaging.

Central Profunda Femoral Vein: No evidence of thrombus. Normal
compressibility and flow on color Doppler imaging.

Femoral Vein: No evidence of thrombus. Normal compressibility,
respiratory phasicity and response to augmentation.

Popliteal Vein: No evidence of thrombus. Normal compressibility,
respiratory phasicity and response to augmentation.

Calf Veins: No evidence of thrombus. Normal compressibility and flow
on color Doppler imaging.

Other Findings:  None.

LEFT LOWER EXTREMITY

Common Femoral Vein: No evidence of thrombus. Normal
compressibility, respiratory phasicity and response to augmentation.
IMPRESSION: No evidence of right lower extremity deep venous thrombosis.

## 2022-06-17 IMAGING — US US EXTREM LOW VENOUS*R*
1 series · 14 of 24 positions shown · non-contrast
Comparison: Doppler ultrasound 06/02/2021

CLINICAL DATA: Swelling and pain of the right lung

EXAM:
RIGHT LOWER EXTREMITY VENOUS DOPPLER ULTRASOUND
TECHNIQUE: Gray-scale sonography with compression, as well as color and duplex
ultrasound, were performed to evaluate the deep venous system(s)
from the level of the common femoral vein through the popliteal and
proximal calf veins.

[Series 1: us venous img lower uni right (dvt) · portal-venous · 14 of 31 slices shown]
[im 1/31]
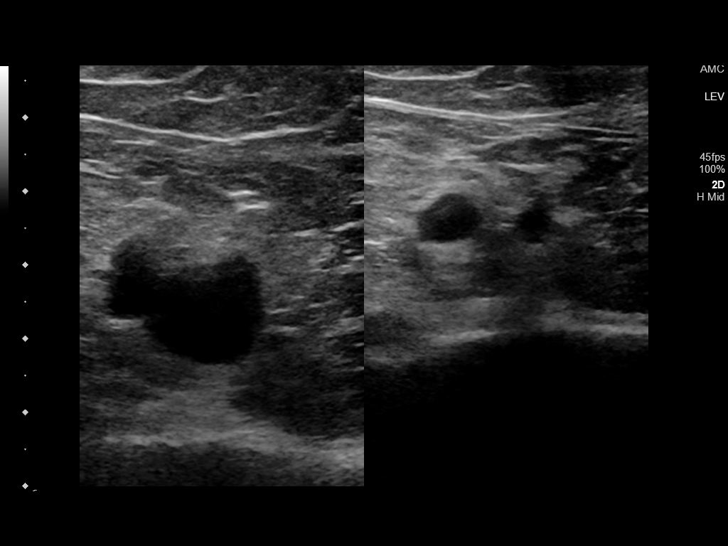
[im 3/31]
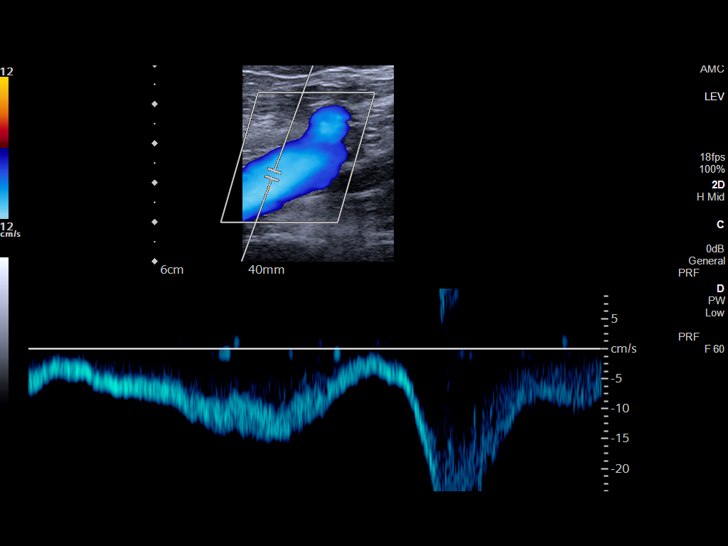
[im 6/31]
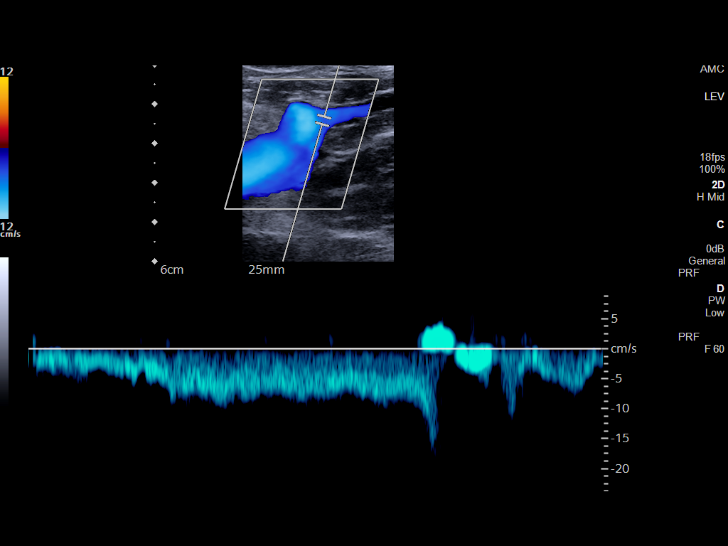
[im 8/31]
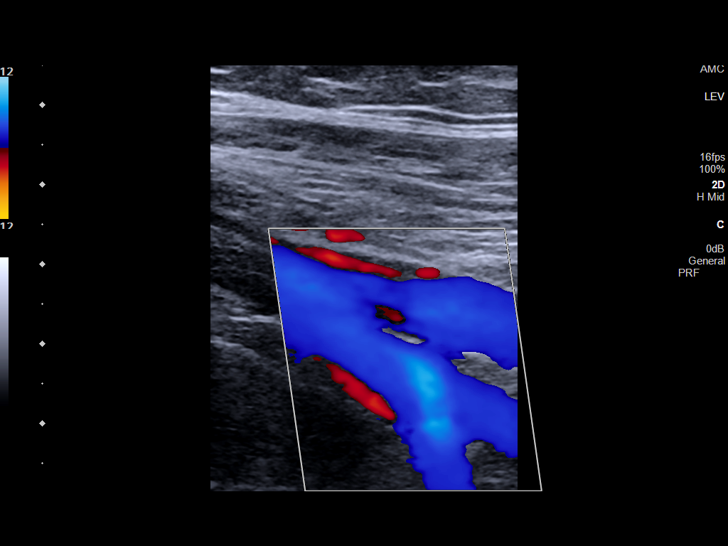
[im 10/31]
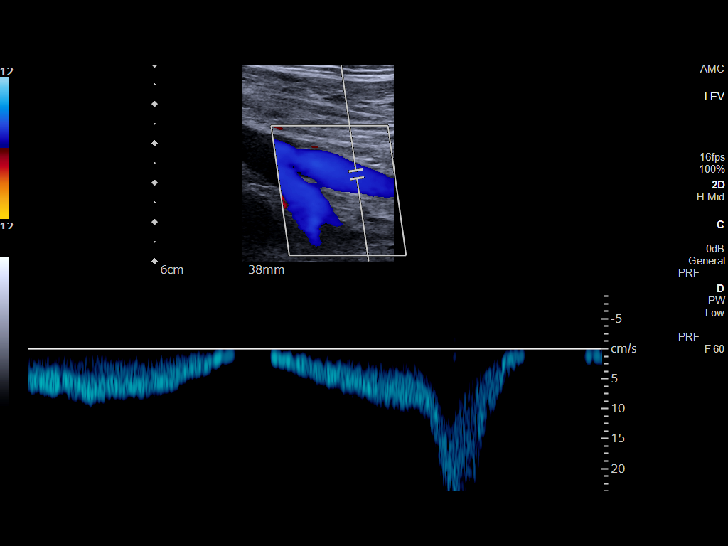
[im 12/31]
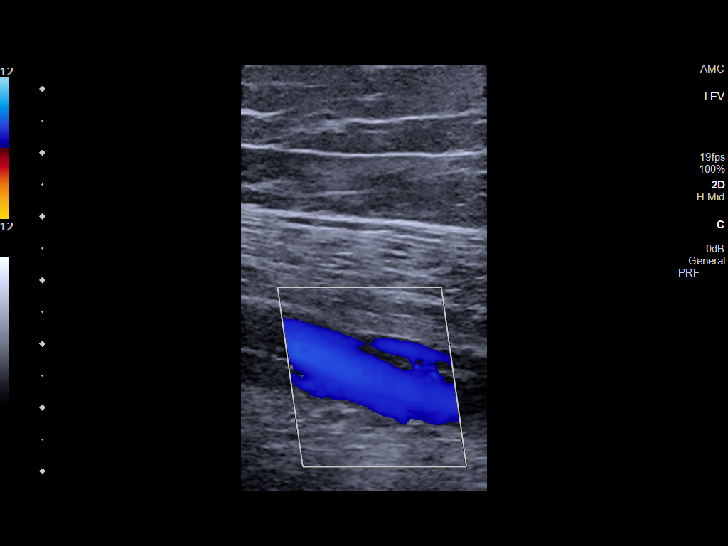
[im 15/31]
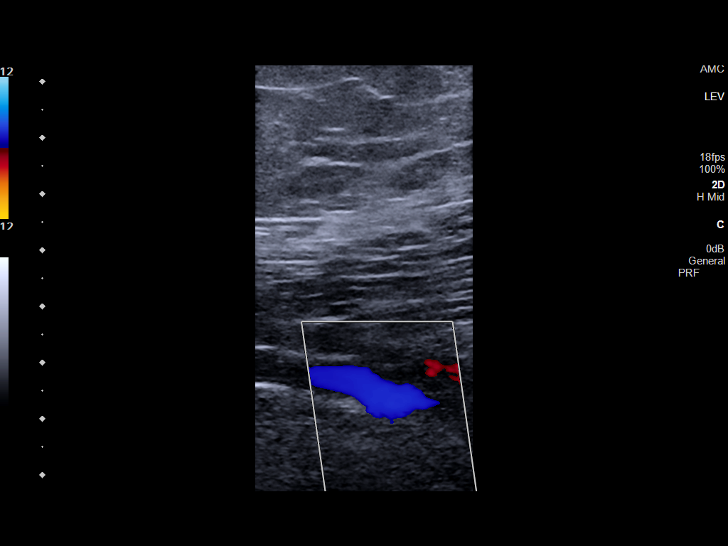
[im 16/31]
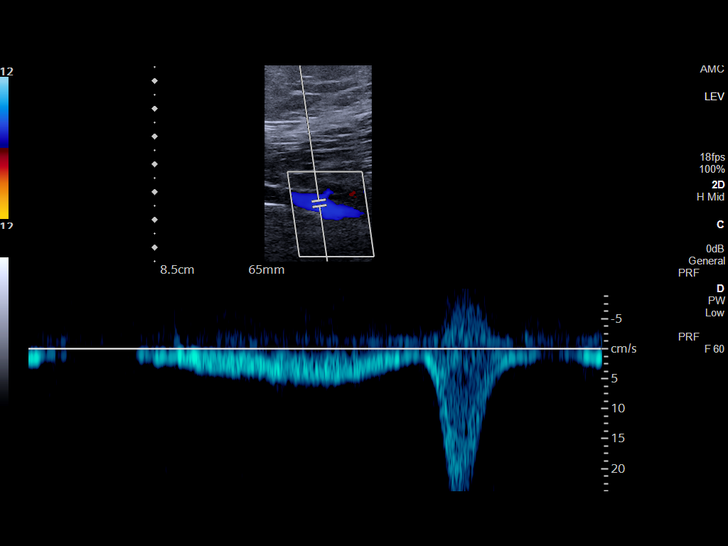
[im 19/31]
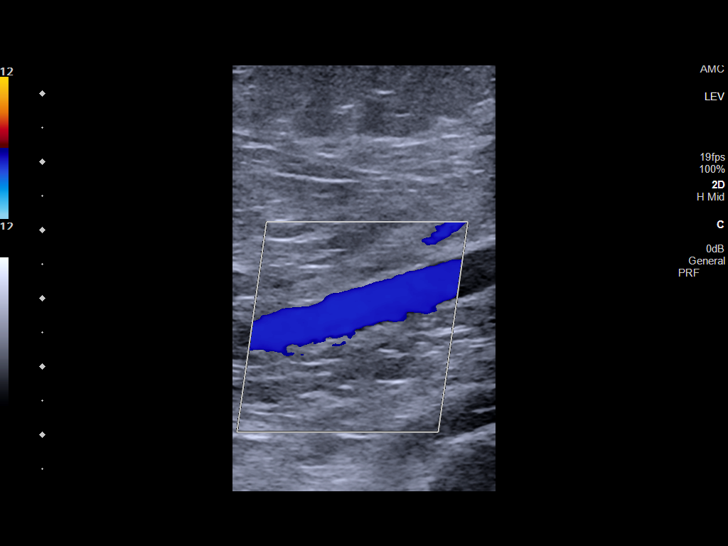
[im 21/31]
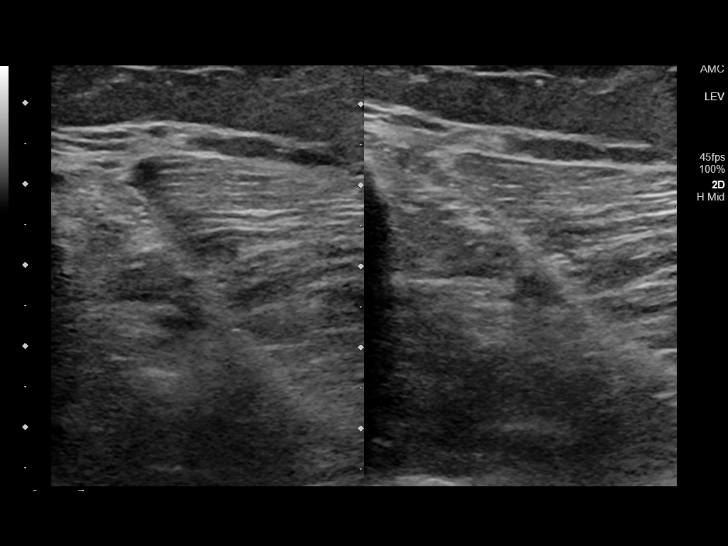
[im 24/31]
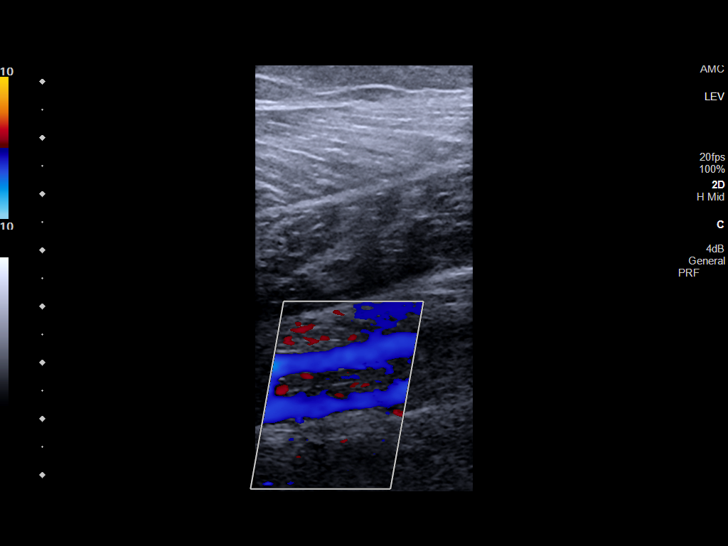
[im 25/31]
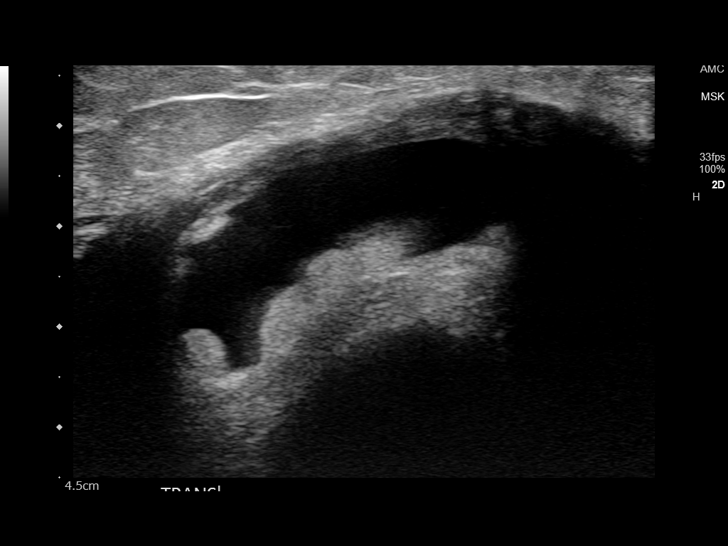
[im 28/31]
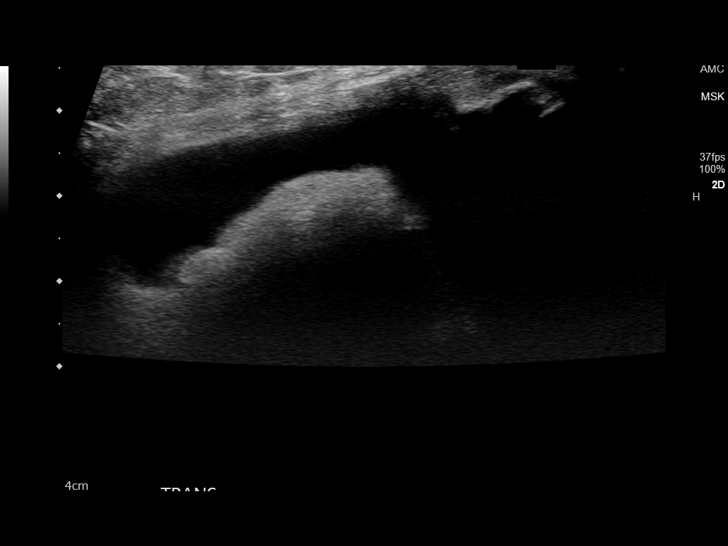
[im 31/31]
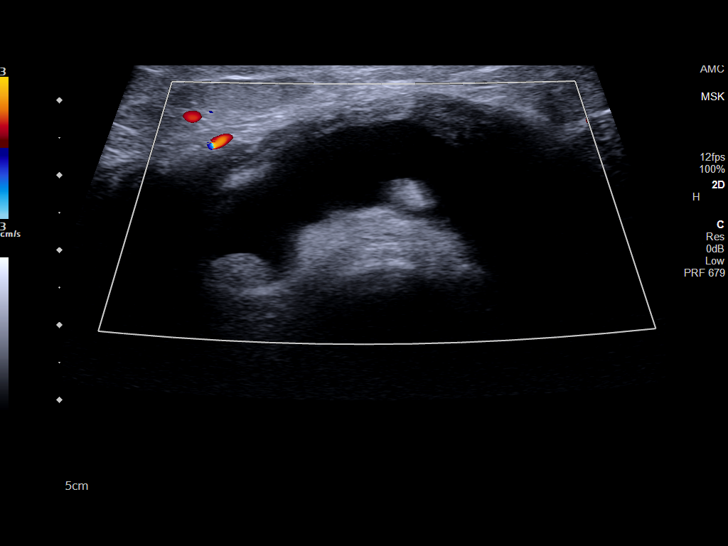

[14 of 24 positions shown; findings below may reference images not displayed]

FINDINGS: VENOUS

Normal compressibility of the common femoral, superficial femoral,
and popliteal veins, as well as the visualized calf veins.
Visualized portions of profunda femoral vein and great saphenous
vein unremarkable. No filling defects to suggest DVT on grayscale or
color Doppler imaging. Doppler waveforms show normal direction of
venous flow, normal respiratory plasticity and response to
augmentation.

Limited views of the contralateral common femoral vein are
unremarkable.

OTHER

Sizable right knee joint effusion noted incidentally.

Limitations: none
IMPRESSION: No evidence of deep venous thrombosis right extremity.

Right knee joint effusion noted in the area of palpable swelling
along the lateral knee.

## 2022-08-20 ENCOUNTER — Ambulatory Visit
Admission: EM | Admit: 2022-08-20 | Discharge: 2022-08-20 | Disposition: A | Discharge status: Home / Self Care | Attending: Physician Assistant | Admitting: Physician Assistant

## 2022-08-20 ENCOUNTER — Encounter: Payer: Self-pay | Admitting: Emergency Medicine

## 2022-08-20 DIAGNOSIS — Z1152 Encounter for screening for COVID-19: Secondary | ICD-10-CM | POA: Insufficient documentation

## 2022-08-20 DIAGNOSIS — J029 Acute pharyngitis, unspecified: Secondary | ICD-10-CM | POA: Diagnosis not present

## 2022-08-20 DIAGNOSIS — U071 COVID-19: Secondary | ICD-10-CM | POA: Diagnosis present

## 2022-08-20 LAB — SARS CORONAVIRUS 2 BY RT PCR: SARS Coronavirus 2 by RT PCR: POSITIVE — AB

## 2022-08-20 NOTE — Discharge Instructions (Addendum)
-  You will need to isolate until Tuesday and then wear a mask for 5 days. - Continue Mucinex and plenty rest and fluids.  Consider throat lozenges and Chloraseptic spray. - You should be feeling better within 7 to 10 days.

## 2022-08-20 NOTE — ED Triage Notes (Signed)
Pt developed a ST yesterday and took an at home covid test and it was positive. She wants to confirm that she has Covid.

## 2022-08-20 NOTE — ED Provider Notes (Signed)
[facility]    CSN: [id number] Arrival date & time: 08/20/22  1761      History   Chief Complaint Chief Complaint  Patient presents with   Covid Positive    HPI Jessica Mcfarland is a 31 y.o. female presenting for sore throat that began yesterday. She reports that she did perform a home COVID test and it was positive. She wants another test to double check.  She says she went to a concert over the weekend and another person that she went with tested positive for COVID.  She denies fever but says she woke up in a sweat yesterday.  Denies fatigue, body aches, cough, congestion, breathing difficulty.  Did have an episode of diarrhea today but no nausea or vomiting.  She took 2 snacks yesterday for her symptoms.  Has not taken any medication today.  She says that she is healthy and does not take any routine medications.  No other complaints.  HPI  History reviewed. No pertinent past medical history.  There are no problems to display for this patient.   Past Surgical History:  Procedure Laterality Date   NO PAST SURGERIES      OB History   No obstetric history on file.      Home Medications    Prior to Admission medications   Medication Sig Start Date End Date Taking? Authorizing Provider  cyclobenzaprine (FLEXERIL) 10 MG tablet Take 1 tablet (10 mg total) by mouth 2 (two) times daily as needed for muscle spasms. 10/13/21   Mickie Bail, NP  dicyclomine (BENTYL) 20 MG tablet Take 1 tablet (20 mg total) by mouth 2 (two) times daily. 11/11/20   Becky Augusta, NP  meloxicam (MOBIC) 15 MG tablet Take 15 mg by mouth daily. 06/18/21   [provider]  methylPREDNISolone (MEDROL DOSEPAK) 4 MG TBPK tablet Follow package insert 10/22/21   Virgina Norfolk, DO    Family History Family History  Problem Relation Age of Onset   Arthritis Mother    Healthy Father     Social History Social History   Tobacco Use   Smoking status: Never   Smokeless  tobacco: Never  Vaping Use   Vaping Use: Never used  Substance Use Topics   Alcohol use: Yes    Comment: socially   Drug use: Never     Allergies   Patient has no known allergies.   Review of Systems Review of Systems  Constitutional:  Negative for chills, fatigue and fever.  HENT:  Positive for sore throat. Negative for congestion, ear pain, rhinorrhea, sinus pressure and sinus pain.   Respiratory:  Negative for cough and shortness of breath.   Gastrointestinal:  Positive for diarrhea. Negative for abdominal pain, nausea and vomiting.  Musculoskeletal:  Negative for arthralgias and myalgias.  Skin:  Negative for rash.  Neurological:  Negative for weakness and headaches.  Hematological:  Negative for adenopathy.     Physical Exam Triage Vital Signs ED Triage Vitals  Enc Vitals Group     BP 08/20/22 1028 132/72     Pulse Rate 08/20/22 1028 83     Resp 08/20/22 1028 18     Temp 08/20/22 1033 98.7 F (37.1 C)     Temp Source 08/20/22 1033 Oral     SpO2 08/20/22 1028 98 %     Weight --      Height --      Head Circumference --      Peak Flow --  Pain Score 08/20/22 1025 3     Pain Loc --      Pain Edu? --      Excl. in [place]? --    No data found.  Updated Vital Signs BP 132/72 (BP Location: Right Wrist)   Pulse 83   Temp 98.7 F (37.1 C) (Oral)   Resp 18   LMP 08/15/2022   SpO2 98%    Physical Exam Vitals and nursing note reviewed.  Constitutional:      General: She is not in acute distress.    Appearance: Normal appearance. She is not ill-appearing or toxic-appearing.  HENT:     Head: Normocephalic and atraumatic.     Nose: Nose normal.     Mouth/Throat:     Mouth: Mucous membranes are moist.     Pharynx: Oropharynx is clear. Posterior oropharyngeal erythema (mild) present.  Eyes:     General: No scleral icterus.       Right eye: No discharge.        Left eye: No discharge.     Conjunctiva/sclera: Conjunctivae normal.  Cardiovascular:     Rate  and Rhythm: Normal rate and regular rhythm.     Heart sounds: Normal heart sounds.  Pulmonary:     Effort: Pulmonary effort is normal. No respiratory distress.     Breath sounds: Normal breath sounds.  Musculoskeletal:     Cervical back: Neck supple.  Skin:    General: Skin is dry.  Neurological:     General: No focal deficit present.     Mental Status: She is alert. Mental status is at baseline.     Motor: No weakness.     Gait: Gait normal.  Psychiatric:        Mood and Affect: Mood normal.        Behavior: Behavior normal.        Thought Content: Thought content normal.      UC Treatments / Results  Labs (all labs ordered are listed, but only abnormal results are displayed) Labs Reviewed  SARS CORONAVIRUS 2 BY RT PCR    EKG   Radiology No results found.  Procedures Procedures (including critical care time)  Medications Ordered in UC Medications - No data to display  Initial Impression / Assessment and Plan / UC Course  I have reviewed the triage vital signs and the nursing notes.  Pertinent labs & imaging results that were available during my care of the patient were reviewed by me and considered in my medical decision making (see chart for details).   31 y/o female presents for sore throat since yesterday.  Also reports diarrhea.  Attended a large concert over the weekend.  Another person she was with tested positive for COVID.  Positive at home COVID test today.  VSS and patient is overall well appearing.  On exam she has mild erythema posterior pharynx.  PCR COVID test obtained. Positive.  Reviewed results with patient.  We discussed COVID antiviral medication but she says she does not really think she needs it.  Reviewed current CDC guidelines, isolation protocol and ED precautions.  Advised continue Mucinex and plenty rest and fluids.  Advise follow-up as needed especially if symptoms are significantly worsening or not improving over the next 7 to 10 days.   Work note provided.   Final Clinical Impressions(s) / UC Diagnoses   Final diagnoses:  COVID-19  Sore throat  Encounter for screening for COVID-19     Discharge Instructions      -  You will need to isolate until Tuesday and then wear a mask for 5 days. - Continue Mucinex and plenty rest and fluids.  Consider throat lozenges and Chloraseptic spray. - You should be feeling better within 7 to 10 days.     ED Prescriptions   None    PDMP not reviewed this encounter.   Shirlee Latch, PA-C 08/20/22 1125

## 2022-11-04 IMAGING — MR MR LUMBAR SPINE W/O CM
5 series · 31 of 48 positions shown · non-contrast
Comparison: None available.

CLINICAL DATA: Initial evaluation for acute low back pain with
extension into the right lower extremity.

EXAM:
MRI LUMBAR SPINE WITHOUT CONTRAST
TECHNIQUE: Multiplanar, multisequence MR imaging of the lumbar spine was
performed. No intravenous contrast was administered.

[Series 9: T2 · sagittal · 4.0mm · 0.81mm/px · 6 of 17 slices shown (1 of 2)]
[im 1/17]
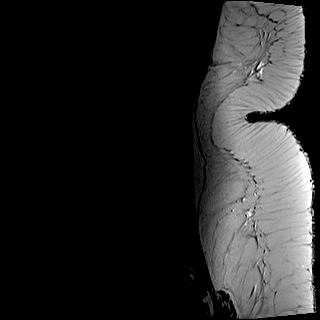
[im 4/17]
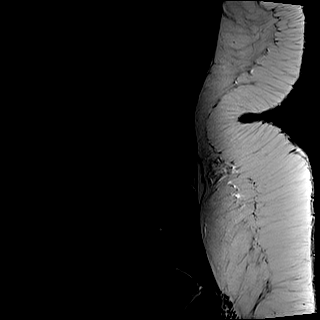
[im 7/17]
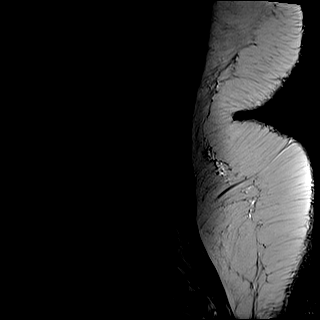
[im 10/17]
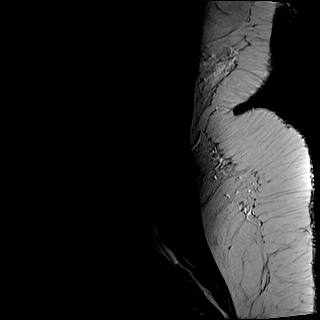
[im 13/17]
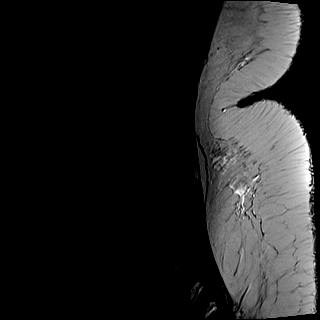
[im 17/17]
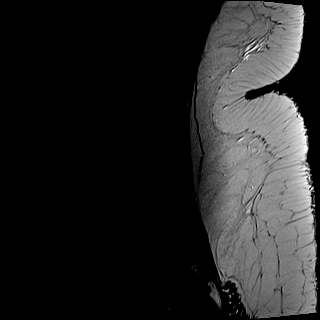

[Series 10: T1 · sagittal · 4.0mm · 0.81mm/px · 6 of 17 slices shown (1 of 2)]
[im 1/17]
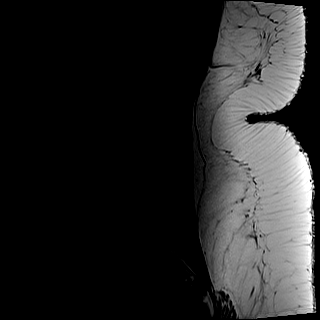
[im 4/17]
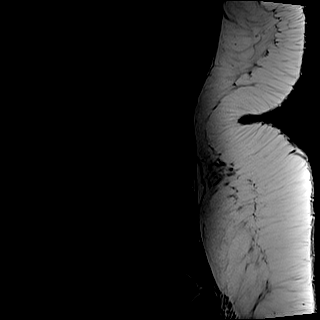
[im 7/17]
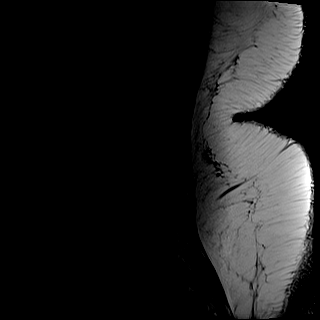
[im 10/17]
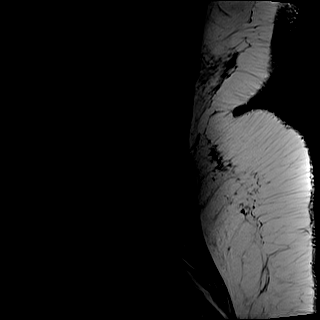
[im 13/17]
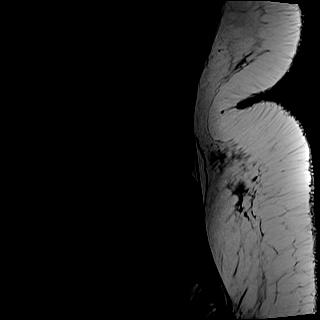
[im 17/17]
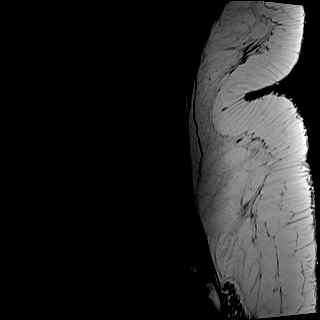

[Series 11: STIR · sagittal · 4.0mm · 0.41mm/px · 1 of 17 slices shown]
[im 1/17]
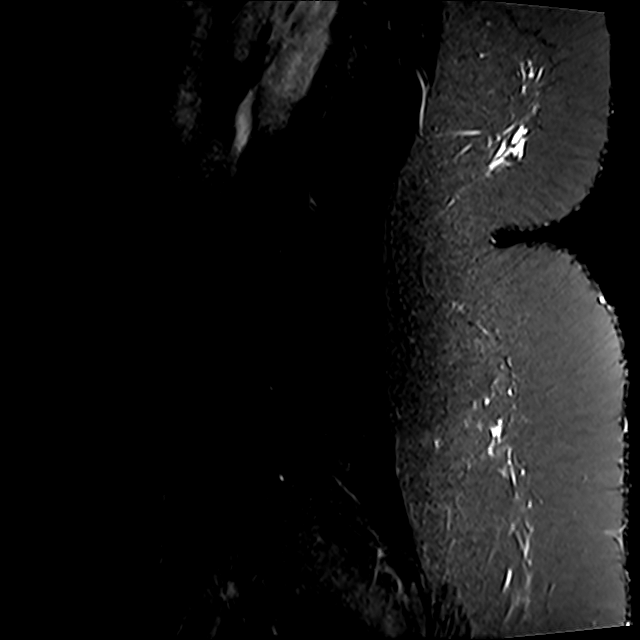

[Series 12: T2 · axial · 4.0mm · 0.78mm/px · z∈[-253,-8]mm · 9 of 40 slices shown (2 of 2)]
[im 1/40]
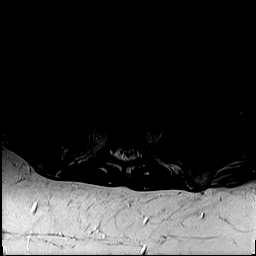
[im 6/40]
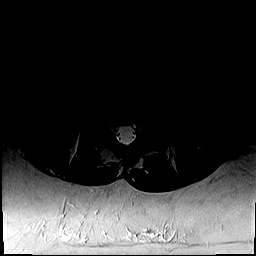
[im 12/40]
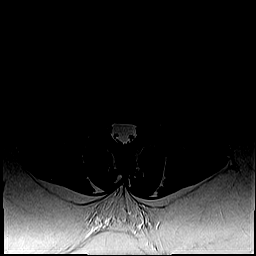
[im 17/40]
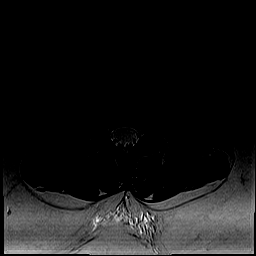
[im 20/40]
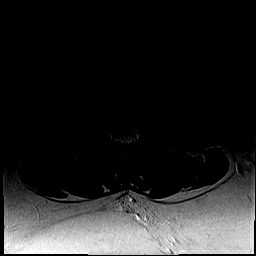
[im 23/40]
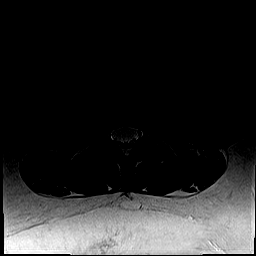
[im 28/40]
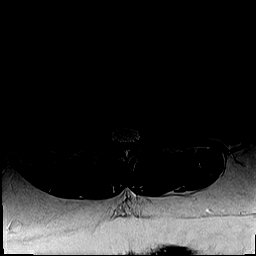
[im 34/40]
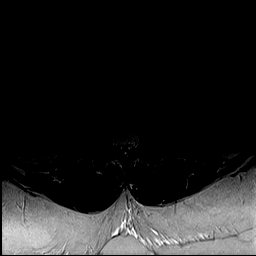
[im 40/40]
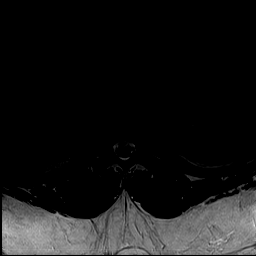

[Series 13: T1 · axial · 4.0mm · 0.39mm/px · z∈[-253,-8]mm · 9 of 40 slices shown (2 of 2)]
[im 1/40]
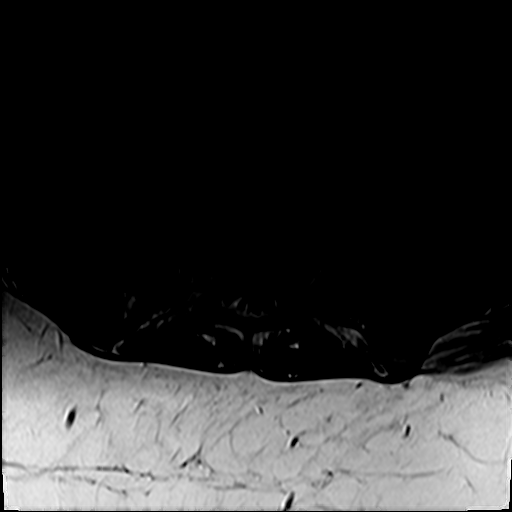
[im 6/40]
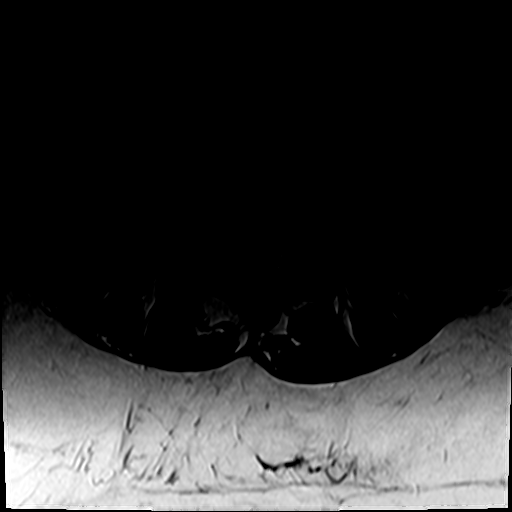
[im 12/40]
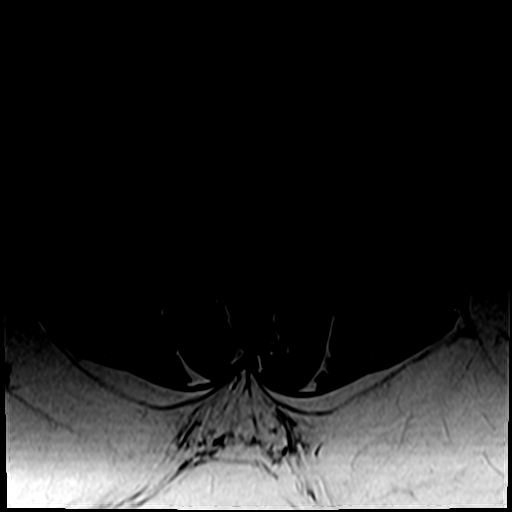
[im 17/40]
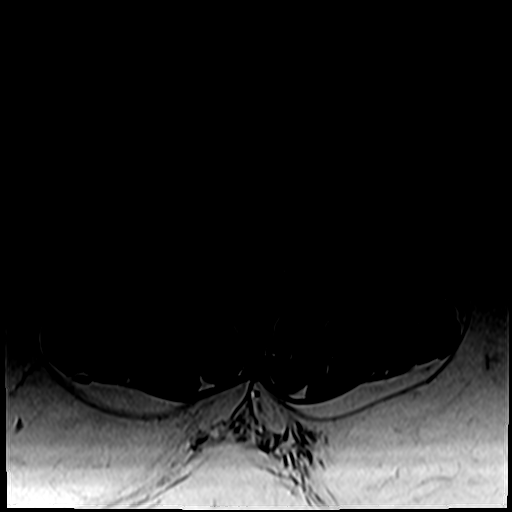
[im 20/40]
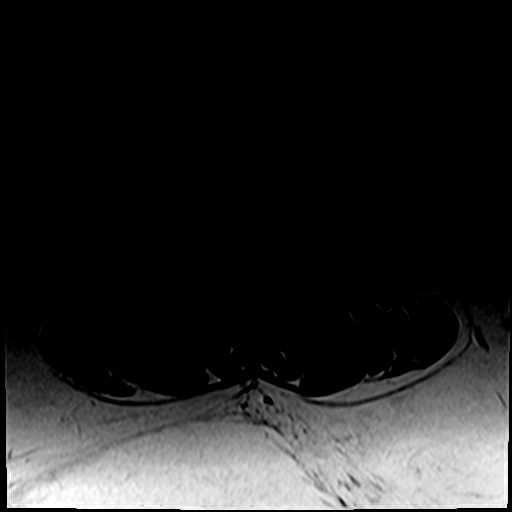
[im 23/40]
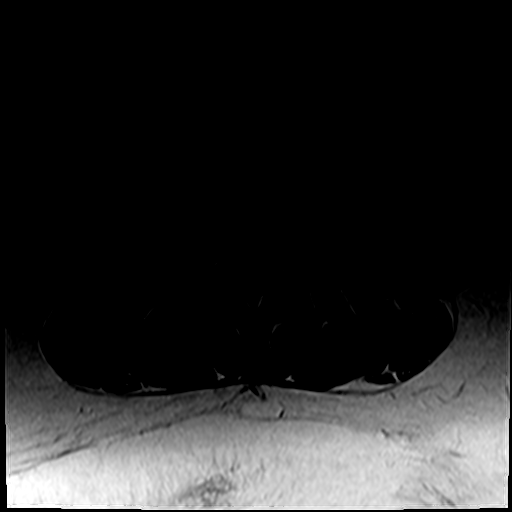
[im 28/40]
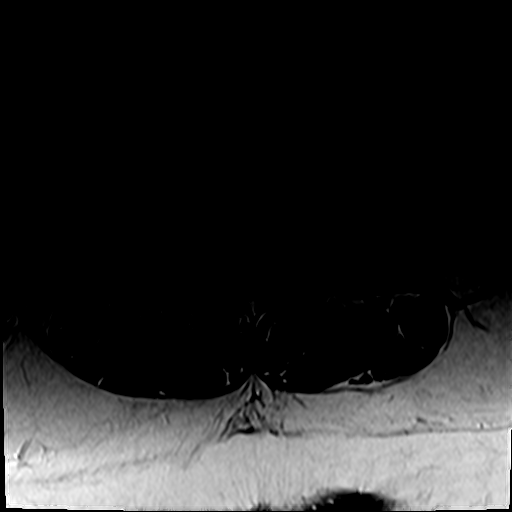
[im 34/40]
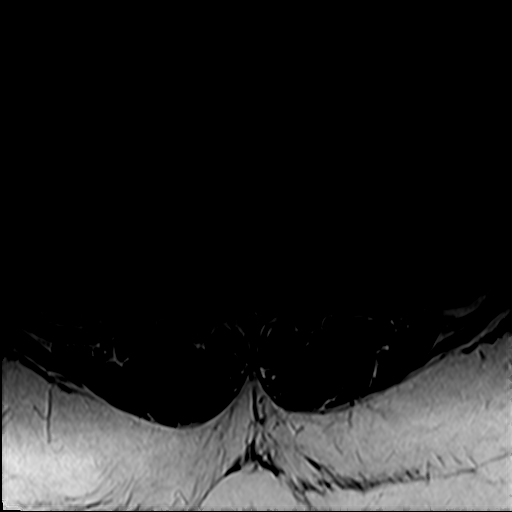
[im 40/40]
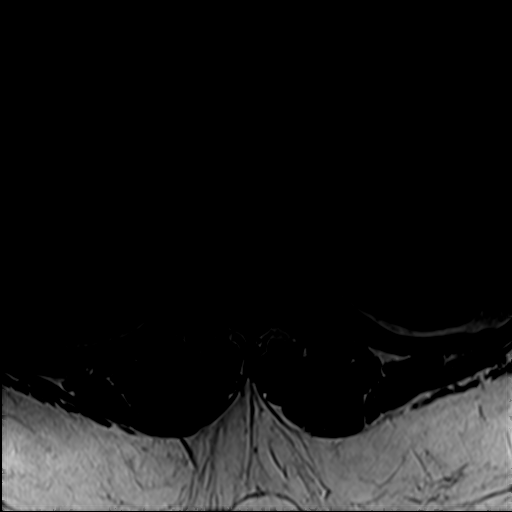

[31 of 48 positions shown; findings below may reference images not displayed]

FINDINGS: Segmentation: Standard. Lowest well-formed disc space labeled the
L5-S1 level.

Alignment: Physiologic with preservation of the normal lumbar
lordosis. No listhesis.

Vertebrae: Vertebral body height maintained without acute or chronic
fracture. Bone marrow signal intensity within normal limits. No
discrete or worrisome osseous lesions. Minimal reactive endplate
change present about the L4-5 and L5-S1 interspaces. No other
abnormal marrow edema.

Conus medullaris and cauda equina: Conus extends to the L1 level.
Conus and cauda equina appear normal.

Paraspinal and other soft tissues: Unremarkable.

Disc levels:

No significant findings are seen through the L3-4 level.

L4-5: Disc desiccation with mild disc bulge. Superimposed shallow
central to left subarticular disc protrusion mildly flattens the
left ventral thecal sac. Associated annular fissure. No significant
canal or lateral recess stenosis. Foramina remain patent.

L5-S1: Disc desiccation with mild disc bulge. Superimposed right
subarticular disc extrusion with inferior migration (series 9, image
8). Disc material impinges upon the descending right S1 nerve root
as it courses through the right lateral recess. Moderate right
lateral recess stenosis. Central canal remains widely patent. No
foraminal stenosis.
IMPRESSION: 1. Right subarticular disc extrusion with inferior migration at
L5-S1, impinging upon the descending right S1 nerve root as it
courses through the right lateral recess.
2. Shallow central to left subarticular disc protrusion at L4-5
without significant stenosis or neural impingement.

## 2023-03-19 ENCOUNTER — Encounter: Payer: Self-pay | Admitting: Nurse Practitioner

## 2023-03-19 ENCOUNTER — Ambulatory Visit (INDEPENDENT_AMBULATORY_CARE_PROVIDER_SITE_OTHER): Admitting: Nurse Practitioner

## 2023-03-19 VITALS — BP 122/82 | HR 94 | Temp 97.9°F | Ht 69.0 in | Wt 305.4 lb

## 2023-03-19 DIAGNOSIS — E559 Vitamin D deficiency, unspecified: Secondary | ICD-10-CM | POA: Diagnosis not present

## 2023-03-19 DIAGNOSIS — R7303 Prediabetes: Secondary | ICD-10-CM | POA: Diagnosis not present

## 2023-03-19 DIAGNOSIS — E041 Nontoxic single thyroid nodule: Secondary | ICD-10-CM | POA: Diagnosis not present

## 2023-03-19 LAB — VITAMIN D 25 HYDROXY (VIT D DEFICIENCY, FRACTURES): VITD: 22.37 ng/mL — ABNORMAL LOW (ref 30.00–100.00)

## 2023-03-19 LAB — HEMOGLOBIN A1C: Hgb A1c MFr Bld: 5.4 % (ref 4.6–6.5)

## 2023-03-19 NOTE — Progress Notes (Unsigned)
New Patient Office Visit  Subjective    Patient ID: Jessica Mcfarland, female    DOB: [birth year]  Age: 32 y.o. MRN: [id number]  CC:  Chief Complaint  Patient presents with   Establish Care    HPI Jessica Mcfarland presents to establish care.  She has not been to the PCP in a while.  She works in the [employer] department at [employer] in an [employer]. She lives with her parents.  She has h/o prediabetes, morbid obesity, vitamin D deficiency and thyroid nodules that was first dignosed at [place] in 2019. [facility]  US results below from care everywhere from 01/03/21:   Thyroid:  Right lobe:  5.9 x 1.7 x 1.7 cm  Left lobe:  5.7 x 1.8 x 1.5 cm  Isthmus:  0.4 cm  Background echotexture: Homogeneous   Additional Pertinent Findings: Multiple lymph nodes seen in right lateral  neck, largest 3.4 x 1.5 x 1.0 cm. Cine clip included. Lymph node seen in  left lateral neck measuring 1.6 x 0.7 x 0.2 cm   IMPRESSION:   1. Thyroid gland is mildly enlarged but there are no focal nodules detected  2. Enlarged right cervical lymph nodes. Some of these have thickened cortex  with diminished fatty hilum. Recommend correlation with physical  examination. Further evaluation could be obtained with C   She is not taking any routine medication.  Lmp: 03/03/23    Health Maintenance  Topic Date Due   HIV Screening  Never done   Hepatitis C Screening  Never done   DTaP/Tdap/Td (1 - Tdap) Never done   PAP SMEAR-Modifier  Never done   COVID-19 Vaccine (3 - 2023-24 season) 08/14/2022   INFLUENZA VACCINE  07/15/2023   HPV VACCINES  Aged Out    There are no preventive care reminders to display for this patient.  Outpatient Encounter Medications as of 03/19/2023  Medication Sig   cyclobenzaprine (FLEXERIL) 10 MG tablet Take 1 tablet (10 mg total) by mouth 2 (two) times daily as needed for muscle spasms. (Patient not taking:  Reported on 03/19/2023)   dicyclomine (BENTYL) 20 MG tablet Take 1 tablet (20 mg total) by mouth 2 (two) times daily. (Patient not taking: Reported on 03/19/2023)   meloxicam (MOBIC) 15 MG tablet Take 15 mg by mouth daily. (Patient not taking: Reported on 03/19/2023)   methylPREDNISolone (MEDROL DOSEPAK) 4 MG TBPK tablet Follow package insert (Patient not taking: Reported on 03/19/2023)   No facility-administered encounter medications on file as of 03/19/2023.    History reviewed. No pertinent past medical history.  Past Surgical History:  Procedure Laterality Date   NO PAST SURGERIES      Family History  Problem Relation Age of Onset   Arthritis Mother    Hypertension Mother    Diabetes Father    Hypertension Father    Hypertension Maternal Grandmother    Heart failure Maternal Grandmother    Hypertension Maternal Grandfather    Heart failure Maternal Grandfather    Hypertension Paternal Grandmother    Cancer Paternal Grandmother    Hypertension Paternal Grandfather    Cancer Paternal Grandfather     Social History   Socioeconomic History   Marital status: Single    Spouse name: Not on file   Number of children: Not on file   Years of education: Not on file   Highest education level: Not on file  Occupational History  Not on file  Tobacco Use   Smoking status: Never   Smokeless tobacco: Never  Vaping Use   Vaping Use: Never used  Substance and Sexual Activity   Alcohol use: Yes    Comment: socially   Drug use: Never   Sexual activity: Not Currently    Birth control/protection: None  Other Topics Concern   Not on file  Social History Narrative   Not on file   Social Determinants of Health   Financial Resource Strain: Not on file  Food Insecurity: Not on file  Transportation Needs: Not on file  Physical Activity: Not on file  Stress: Not on file  Social Connections: Not on file  Intimate Partner Violence: Not on file    Review of Systems  Constitutional:  Negative.   HENT: Negative.    Eyes: Negative.   Respiratory:  Negative for shortness of breath.   Cardiovascular:  Negative for palpitations and leg swelling.  Gastrointestinal: Negative.   Genitourinary: Negative.   Musculoskeletal: Negative.   Skin: Negative.   Neurological: Negative.   Psychiatric/Behavioral: Negative.          Objective    BP 122/82   Pulse 94   Temp 97.9 F (36.6 C) (Oral)   Ht 5\' 9"  (1.753 m)   Wt (!) 305 lb 6.4 oz (138.5 kg)   LMP  (LMP Unknown)   SpO2 99%   BMI 45.10 kg/m   Physical Exam Constitutional:      Appearance: Normal appearance. She is normal weight.  HENT:     Head: Normocephalic.     Right Ear: Tympanic membrane normal.     Left Ear: Tympanic membrane normal.     Mouth/Throat:     Mouth: Mucous membranes are moist.  Eyes:     Extraocular Movements: Extraocular movements intact.     Conjunctiva/sclera: Conjunctivae normal.     Pupils: Pupils are equal, round, and reactive to light.  Neck:     Thyroid: No thyroid mass or thyroid tenderness.  Cardiovascular:     Rate and Rhythm: Normal rate and regular rhythm.     Pulses: Normal pulses.     Heart sounds: Normal heart sounds. No murmur heard. Pulmonary:     Effort: Pulmonary effort is normal.     Breath sounds: Normal breath sounds.  Abdominal:     General: Bowel sounds are normal.     Palpations: Abdomen is soft. There is no mass.     Tenderness: There is no abdominal tenderness. There is no rebound.  Musculoskeletal:        General: No swelling.     Cervical back: Neck supple. No tenderness.     Right lower leg: No edema.     Left lower leg: No edema.  Skin:    Findings: No bruising, erythema or rash.  Neurological:     General: No focal deficit present.     Mental Status: She is alert and oriented to person, place, and time. Mental status is at baseline.  Psychiatric:        Mood and Affect: Mood normal.        Behavior: Behavior normal.        Thought Content:  Thought content normal.        Judgment: Judgment normal.         Assessment & Plan:  Thyroid nodule Assessment & Plan: She denies symptoms from hypo or hyperthyroidism. Ultrasound of the thyroid ordered.  Orders: -  US THYROID -     TSH; Future  Vitamin D deficiency Assessment & Plan: Will check vitamin D levels.  Orders: -     VITAMIN D 25 Hydroxy (Vit-D Deficiency, Fractures)  Morbid obesity Assessment & Plan: Body mass index is 45.1 kg/m. Advised pt to lose weight. Advised patient to avoid trans fat, fatty and fried food. Follow a regular physical activity schedule. Went over the risk of chronic diseases with increased weight.      Orders: -     CBC with Differential/Platelet; Future -     Comprehensive metabolic panel; Future -     Lipid panel; Future  Prediabetes Assessment & Plan: Her last hemoglobin A1c 5.7 on 12/02/20 Advised pt to eat variety of food including fruits, vegetables, whole grains, complex carbohydrates and proteins.  Will check hemoglobin A1c.   Orders: -     Hemoglobin A1c    Return in about 1 month (around 04/18/2023) for physical.   Kara Diesharanpreet  Venecia Mehl, NP

## 2023-03-19 NOTE — Patient Instructions (Signed)
Labs ordered. Ultrasound of the right has been ordered.

## 2023-03-25 DIAGNOSIS — E559 Vitamin D deficiency, unspecified: Secondary | ICD-10-CM | POA: Insufficient documentation

## 2023-03-25 DIAGNOSIS — E041 Nontoxic single thyroid nodule: Secondary | ICD-10-CM | POA: Insufficient documentation

## 2023-03-25 NOTE — Assessment & Plan Note (Signed)
She denies symptoms from hypo or hyperthyroidism. Ultrasound of the thyroid ordered.

## 2023-03-25 NOTE — Assessment & Plan Note (Signed)
Body mass index is 45.1 kg/m. Advised pt to lose weight. Advised patient to avoid trans fat, fatty and fried food. Follow a regular physical activity schedule. Went over the risk of chronic diseases with increased weight.

## 2023-03-25 NOTE — Assessment & Plan Note (Signed)
-   Will check vitamin D levels.

## 2023-03-25 NOTE — Assessment & Plan Note (Deleted)
She denies symptoms from hypo or hyperthyroidism. Ultrasound of the thyroid ordered. 

## 2023-03-25 NOTE — Assessment & Plan Note (Addendum)
Her last hemoglobin A1c 5.7 on 12/02/20 Advised pt to eat variety of food including fruits, vegetables, whole grains, complex carbohydrates and proteins.  Will check hemoglobin A1c.

## 2023-03-29 ENCOUNTER — Encounter: Payer: Self-pay | Admitting: *Deleted

## 2023-04-02 ENCOUNTER — Ambulatory Visit

## 2023-04-08 ENCOUNTER — Ambulatory Visit
Admission: RE | Admit: 2023-04-08 | Discharge: 2023-04-08 | Disposition: A | Discharge status: Home / Self Care | Source: Ambulatory Visit | Attending: Nurse Practitioner | Admitting: Nurse Practitioner

## 2023-04-08 DIAGNOSIS — E041 Nontoxic single thyroid nodule: Secondary | ICD-10-CM | POA: Insufficient documentation

## 2023-04-09 ENCOUNTER — Ambulatory Visit

## 2023-04-09 ENCOUNTER — Ambulatory Visit: Admitting: Nurse Practitioner

## 2023-04-23 ENCOUNTER — Ambulatory Visit (INDEPENDENT_AMBULATORY_CARE_PROVIDER_SITE_OTHER): Admitting: Nurse Practitioner

## 2023-04-23 ENCOUNTER — Encounter: Payer: Self-pay | Admitting: Nurse Practitioner

## 2023-04-23 VITALS — BP 128/72 | HR 64 | Temp 98.3°F | Ht 69.0 in | Wt 302.6 lb

## 2023-04-23 DIAGNOSIS — E041 Nontoxic single thyroid nodule: Secondary | ICD-10-CM | POA: Diagnosis not present

## 2023-04-23 DIAGNOSIS — Z0000 Encounter for general adult medical examination without abnormal findings: Secondary | ICD-10-CM

## 2023-04-23 LAB — CBC WITH DIFFERENTIAL/PLATELET
Basophils Absolute: 0 K/uL (ref 0.0–0.1)
Basophils Relative: 0.4 % (ref 0.0–3.0)
Eosinophils Absolute: 0.2 K/uL (ref 0.0–0.7)
Eosinophils Relative: 2.7 % (ref 0.0–5.0)
HCT: 41.5 % (ref 36.0–46.0)
Hemoglobin: 14 g/dL (ref 12.0–15.0)
Lymphocytes Relative: 25 % (ref 12.0–46.0)
Lymphs Abs: 2 K/uL (ref 0.7–4.0)
MCHC: 33.7 g/dL (ref 30.0–36.0)
MCV: 83 fl (ref 78.0–100.0)
Monocytes Absolute: 0.4 K/uL (ref 0.1–1.0)
Monocytes Relative: 5.5 % (ref 3.0–12.0)
Neutro Abs: 5.3 K/uL (ref 1.4–7.7)
Neutrophils Relative %: 66.4 % (ref 43.0–77.0)
Platelets: 296 K/uL (ref 150.0–400.0)
RBC: 5 Mil/uL (ref 3.87–5.11)
RDW: 14 % (ref 11.5–15.5)
WBC: 7.9 K/uL (ref 4.0–10.5)

## 2023-04-23 LAB — TSH: TSH: 1.25 u[IU]/mL (ref 0.35–5.50)

## 2023-04-23 LAB — COMPREHENSIVE METABOLIC PANEL WITH GFR
ALT: 13 U/L (ref 0–35)
AST: 13 U/L (ref 0–37)
Albumin: 4 g/dL (ref 3.5–5.2)
Alkaline Phosphatase: 72 U/L (ref 39–117)
BUN: 10 mg/dL (ref 6–23)
CO2: 28 meq/L (ref 19–32)
Calcium: 9.4 mg/dL (ref 8.4–10.5)
Chloride: 104 meq/L (ref 96–112)
Creatinine, Ser: 0.83 mg/dL (ref 0.40–1.20)
GFR: 94.63 mL/min
Glucose, Bld: 90 mg/dL (ref 70–99)
Potassium: 4.3 meq/L (ref 3.5–5.1)
Sodium: 138 meq/L (ref 135–145)
Total Bilirubin: 0.3 mg/dL (ref 0.2–1.2)
Total Protein: 7.3 g/dL (ref 6.0–8.3)

## 2023-04-23 LAB — LIPID PANEL
Cholesterol: 118 mg/dL (ref 0–200)
HDL: 42.7 mg/dL
LDL Cholesterol: 59 mg/dL (ref 0–99)
NonHDL: 75.78
Total CHOL/HDL Ratio: 3
Triglycerides: 86 mg/dL (ref 0.0–149.0)
VLDL: 17.2 mg/dL (ref 0.0–40.0)

## 2023-04-23 NOTE — Progress Notes (Unsigned)
Established Patient Office Visit  Subjective:  Patient ID: Jessica Mcfarland, female    DOB: [birth year]  Age: 32 y.o. MRN: [id number]  CC:  Chief Complaint  Patient presents with   Annual Exam    HPI  DON RUSCHAK presents for annual physical.  Patient presents to the clinic for her annual physical exam.  Flu: 2023 Tetanus: due COVID: Pfizer X2  Pap smear: due Dentist: Nov. 2023 Eye examination: Oct . 2023 Exercise: walking and excercising X 3 times a week. LMP:04/22/23  Diet: Patient does eat meat (Chicken beef, fish). Patient consumes fruits and veggies. Patient eat some fried food. Patient drinks water and soda occasionally .    HPI   History reviewed. No pertinent past medical history.  Past Surgical History:  Procedure Laterality Date   NO PAST SURGERIES      Family History  Problem Relation Age of Onset   Arthritis Mother    Hypertension Mother    Diabetes Father    Hypertension Father    Hypertension Maternal Grandmother    Heart failure Maternal Grandmother    Hypertension Maternal Grandfather    Heart failure Maternal Grandfather    Hypertension Paternal Grandmother    Cancer Paternal Grandmother    Hypertension Paternal Grandfather    Cancer Paternal Grandfather     Social History   Socioeconomic History   Marital status: Single    Spouse name: Not on file   Number of children: Not on file   Years of education: Not on file   Highest education level: Not on file  Occupational History   Not on file  Tobacco Use   Smoking status: Never   Smokeless tobacco: Never  Vaping Use   Vaping Use: Never used  Substance and Sexual Activity   Alcohol use: Yes    Comment: socially   Drug use: Never   Sexual activity: Not Currently    Birth control/protection: None  Other Topics Concern   Not on file  Social History Narrative   Not on file   Social Determinants of Health   Financial Resource Strain: Not on file   Food Insecurity: Not on file  Transportation Needs: Not on file  Physical Activity: Not on file  Stress: Not on file  Social Connections: Not on file  Intimate Partner Violence: Not on file     No outpatient medications prior to visit.   No facility-administered medications prior to visit.    No Known Allergies  ROS Review of Systems  Constitutional: Negative.   HENT: Negative.    Eyes: Negative.   Cardiovascular: Negative.   Gastrointestinal: Negative.   Genitourinary: Negative.   Musculoskeletal: Negative.   Neurological: Negative.   Psychiatric/Behavioral: Negative.        Objective:    Physical Exam Exam conducted with a chaperone present.  Constitutional:      Appearance: Normal appearance. She is obese.  HENT:     Head: Normocephalic.     Right Ear: Tympanic membrane normal.     Left Ear: Tympanic membrane normal.     Nose: Nose normal.     Mouth/Throat:     Mouth: Mucous membranes are moist.     Pharynx: Oropharynx is clear.  Eyes:     Extraocular Movements: Extraocular movements intact.     Conjunctiva/sclera: Conjunctivae normal.     Pupils: Pupils are equal, round, and reactive to light.  Cardiovascular:     Rate and Rhythm: Normal rate and regular rhythm.  Pulses: Normal pulses.     Heart sounds: Normal heart sounds.  Pulmonary:     Effort: Pulmonary effort is normal. No respiratory distress.     Breath sounds: Normal breath sounds. No rhonchi.  Chest:  Breasts:    Right: Normal. No inverted nipple, mass or nipple discharge.     Left: Normal. No inverted nipple, mass or nipple discharge.  Abdominal:     General: Bowel sounds are normal.     Palpations: Abdomen is soft. There is no mass.     Tenderness: There is no abdominal tenderness.     Hernia: No hernia is present.  Musculoskeletal:        General: Normal range of motion.     Cervical back: Neck supple. No tenderness.  Skin:    General: Skin is warm.     Capillary Refill:  Capillary refill takes less than 2 seconds.  Neurological:     General: No focal deficit present.     Mental Status: She is alert and oriented to person, place, and time. Mental status is at baseline.  Psychiatric:        Mood and Affect: Mood normal.        Behavior: Behavior normal.        Thought Content: Thought content normal.        Judgment: Judgment normal.     BP 128/72   Pulse 64   Temp 98.3 F (36.8 C) (Oral)   Ht 5\' 9"  (1.753 m)   Wt (!) 302 lb 9.6 oz (137.3 kg)   SpO2 97%   BMI 44.69 kg/m  Wt Readings from Last 3 Encounters:  04/23/23 (!) 302 lb 9.6 oz (137.3 kg)  03/19/23 (!) 305 lb 6.4 oz (138.5 kg)  10/24/21 (!) 319 lb (144.7 kg)     Health Maintenance  Topic Date Due   HIV Screening  Never done   Hepatitis C Screening  Never done   DTaP/Tdap/Td (1 - Tdap) Never done   PAP SMEAR-Modifier  Never done   COVID-19 Vaccine (3 - 2023-24 season) 08/14/2022   INFLUENZA VACCINE  07/15/2023   HPV VACCINES  Aged Out    There are no preventive care reminders to display for this patient.  Lab Results  Component Value Date   TSH 1.25 04/23/2023   Lab Results  Component Value Date   WBC 7.9 04/23/2023   HGB 14.0 04/23/2023   HCT 41.5 04/23/2023   MCV 83.0 04/23/2023   PLT 296.0 04/23/2023   Lab Results  Component Value Date   NA 138 04/23/2023   K 4.3 04/23/2023   CO2 28 04/23/2023   GLUCOSE 90 04/23/2023   BUN 10 04/23/2023   CREATININE 0.83 04/23/2023   BILITOT 0.3 04/23/2023   ALKPHOS 72 04/23/2023   AST 13 04/23/2023   ALT 13 04/23/2023   PROT 7.3 04/23/2023   ALBUMIN 4.0 04/23/2023   CALCIUM 9.4 04/23/2023   ANIONGAP 6 06/02/2021   GFR 94.63 04/23/2023   Lab Results  Component Value Date   CHOL 118 04/23/2023   Lab Results  Component Value Date   HDL 42.70 04/23/2023   Lab Results  Component Value Date   LDLCALC 59 04/23/2023   Lab Results  Component Value Date   TRIG 86.0 04/23/2023   Lab Results  Component Value Date    CHOLHDL 3 04/23/2023   Lab Results  Component Value Date   HGBA1C 5.4 03/19/2023      Assessment & Plan:  Annual physical exam Assessment & Plan: Discussed ultrasound results Encouraged patient to consume a balanced diet and regular exercise regimen. Advised to see an eye doctor and dentist annually.   Orders: -     TSH -     Lipid panel -     Comprehensive metabolic panel -     CBC with Differential/Platelet  Thyroid nodule  Morbid obesity (HCC) -     Lipid panel -     Comprehensive metabolic panel -     CBC with Differential/Platelet    Follow-up: Return if symptoms worsen or fail to improve.   Kara Dies, NP

## 2023-04-23 NOTE — Patient Instructions (Signed)
Will do labs today

## 2023-04-24 NOTE — Assessment & Plan Note (Addendum)
Discussed ultrasound results Encouraged patient to consume a balanced diet and regular exercise regimen. Advised to see an eye doctor and dentist annually.

## 2024-04-26 NOTE — Progress Notes (Signed)
 Established Patient Office Visit  Subjective:  Patient ID: Jessica Mcfarland, female    DOB: 03-22-1991  Age: 33 y.o. MRN: [id number]  CC:  Chief Complaint  Patient presents with   Annual Exam    HPI  Patient presents to the clinic for annual physical exam.  Diet: Is weightless  program at [facility]. Patient does eat meat. Patient consumes fruits and veggies. Patient eat some fried food. Patient drinks water.  Exercise: 4 days a week exercise  Vaccine Flu: 2024 Tetanus: due COVID: 6 x pfizer  LMP: regular, 04/19/24  Pap smear: Due, scheduled to see GYN at [facility]   Family history:  Colon cancer: No  Breast cancer: Paternal grandmother, Aunt  Dentist: due Ophthalmology: yearly  Past Medical History:  Diagnosis Date   Female hirsutism    Morbid obesity (HCC)    Multinodular goiter    Diagnosed in 2019 in [place]   Vitamin D  deficiency     Past Surgical History:  Procedure Laterality Date   NO PAST SURGERIES      Family History  Problem Relation Age of Onset   Arthritis Mother    Hypertension Mother    Diabetes Father    Hypertension Father    Hypertension Maternal Grandmother    Heart failure Maternal Grandmother    Hypertension Maternal Grandfather    Heart failure Maternal Grandfather    Cancer Paternal Grandmother        breast cancer   Hypertension Paternal Grandmother    Hypertension Paternal Grandfather    Cancer Paternal Grandfather        throat cancer   Cancer Paternal Aunt        breast cancer, uterus cancer    Social History   Socioeconomic History   Marital status: Single    Spouse name: Not on file   Number of children: Not on file   Years of education: Not on file   Highest education level: Master's degree (e.g., MA, MS, MEng, MEd, MSW, MBA)  Occupational History   Not on file  Tobacco Use   Smoking status: Never   Smokeless tobacco: Never  Vaping Use   Vaping status: Never Used  Substance  and Sexual Activity   Alcohol use: Yes    Comment: socially   Drug use: Never   Sexual activity: Not Currently    Birth control/protection: None  Other Topics Concern   Not on file  Social History Narrative   Not on file   Social Drivers of Health   Financial Resource Strain: Low Risk  (04/27/2024)   Overall Financial Resource Strain (CARDIA)    Difficulty of Paying Living Expenses: Not very hard  Food Insecurity: No Food Insecurity (04/27/2024)   Hunger Vital Sign    Worried About Running Out of Food in the Last Year: Never true    Ran Out of Food in the Last Year: Never true  Transportation Needs: No Transportation Needs (04/27/2024)   PRAPARE - Administrator, Civil Service (Medical): No    Lack of Transportation (Non-Medical): No  Physical Activity: Sufficiently Active (04/27/2024)   Exercise Vital Sign    Days of Exercise per Week: 4 days    Minutes of Exercise per Session: 60 min  Stress: No Stress Concern Present (04/27/2024)   [employer] of Occupational Health - Occupational Stress Questionnaire    Feeling of Stress : Not at all  Social Connections: Socially Isolated (04/27/2024)   Social Connection  and Isolation Panel [NHANES]    Frequency of Communication with Friends and Family: More than three times a week    Frequency of Social Gatherings with Friends and Family: More than three times a week    Attends Religious Services: Never    Database administrator or Organizations: No    Attends Engineer, structural: Not on file    Marital Status: Divorced  Intimate Partner Violence: Not on file     Outpatient Medications Prior to Visit  Medication Sig Dispense Refill   phentermine 30 MG capsule Take 30 mg by mouth daily.     topiramate (TOPAMAX) 50 MG tablet Take 50 mg by mouth daily as needed.     Vitamin D , Ergocalciferol , (DRISDOL) 1.25 MG (50000 UNIT) CAPS capsule Take 50,000 Units by mouth 2 (two) times a week.     No facility-administered  medications prior to visit.    No Known Allergies  ROS Review of Systems Negative unless indicated in HPI.    Objective:     Physical Exam Constitutional:      Appearance: Normal appearance. She is normal weight.  HENT:     Head: Normocephalic.     Right Ear: Tympanic membrane normal.     Left Ear: Tympanic membrane normal.     Mouth/Throat:     Mouth: Mucous membranes are moist.  Eyes:     Extraocular Movements: Extraocular movements intact.     Conjunctiva/sclera: Conjunctivae normal.     Pupils: Pupils are equal, round, and reactive to light.  Neck:     Thyroid : No thyroid  mass or thyroid  tenderness.  Cardiovascular:     Rate and Rhythm: Normal rate and regular rhythm.     Pulses: Normal pulses.     Heart sounds: Normal heart sounds. No murmur heard. Pulmonary:     Effort: Pulmonary effort is normal.     Breath sounds: Normal breath sounds.  Abdominal:     General: Bowel sounds are normal.     Palpations: Abdomen is soft. There is no mass.     Tenderness: There is no abdominal tenderness. There is no rebound.  Musculoskeletal:        General: No swelling.     Cervical back: Neck supple. No tenderness.     Right lower leg: No edema.     Left lower leg: No edema.  Skin:    Findings: No bruising, erythema or rash.  Neurological:     General: No focal deficit present.     Mental Status: She is alert and oriented to person, place, and time. Mental status is at baseline.  Psychiatric:        Mood and Affect: Mood normal.        Behavior: Behavior normal.        Thought Content: Thought content normal.        Judgment: Judgment normal.     BP 118/70   Pulse 91   Temp 97.9 F (36.6 C)   Ht 5\' 9"  (1.753 m)   Wt 296 lb 12.8 oz (134.6 kg)   SpO2 99%   BMI 43.83 kg/m  Wt Readings from Last 3 Encounters:  04/27/24 296 lb 12.8 oz (134.6 kg)  04/23/23 (!) 302 lb 9.6 oz (137.3 kg)  03/19/23 (!) 305 lb 6.4 oz (138.5 kg)     Health Maintenance  Topic Date  Due   Hepatitis C Screening  Never done   Cervical Cancer Screening (HPV/Pap Cotest)  12/05/2022  INFLUENZA VACCINE  07/14/2024   DTaP/Tdap/Td (2 - Td or Tdap) 04/27/2034   COVID-19 Vaccine  Completed   HIV Screening  Completed   HPV VACCINES  Aged Out   Meningococcal B Vaccine  Aged Out    There are no preventive care reminders to display for this patient.  Lab Results  Component Value Date   TSH 1.25 04/23/2023   Lab Results  Component Value Date   WBC 7.9 04/23/2023   HGB 14.0 04/23/2023   HCT 41.5 04/23/2023   MCV 83.0 04/23/2023   PLT 296.0 04/23/2023   Lab Results  Component Value Date   NA 138 04/23/2023   K 4.3 04/23/2023   CO2 28 04/23/2023   GLUCOSE 90 04/23/2023   BUN 10 04/23/2023   CREATININE 0.83 04/23/2023   BILITOT 0.3 04/23/2023   ALKPHOS 72 04/23/2023   AST 13 04/23/2023   ALT 13 04/23/2023   PROT 7.3 04/23/2023   ALBUMIN 4.0 04/23/2023   CALCIUM 9.4 04/23/2023   ANIONGAP 6 06/02/2021   GFR 94.63 04/23/2023   Lab Results  Component Value Date   CHOL 118 04/23/2023   Lab Results  Component Value Date   HDL 42.70 04/23/2023   Lab Results  Component Value Date   LDLCALC 59 04/23/2023   Lab Results  Component Value Date   TRIG 86.0 04/23/2023   Lab Results  Component Value Date   CHOLHDL 3 04/23/2023   Lab Results  Component Value Date   HGBA1C 5.4 03/19/2023      Assessment & Plan:  Annual physical exam Assessment & Plan: Routine health maintenance is up to date except for tetanus vaccine. Current with flu and COVID vaccinations. - Administer tetanus vaccine. - Encouraged patient to consume a balanced diet and regular exercise regimen. -Advised to see an eye doctor and dentist annually.  -Defer Pap and breast examination, scheduled to gyn at [facility]. Will send us  the record.    Need for diphtheria-tetanus-pertussis (Tdap) vaccine -     Tdap vaccine greater than or equal to 7yo IM  Multinodular goiter Assessment &  Plan: Diagnosed in [place] in 2019. - Thyroid  US  in 2020, 2022 and 2024  Thyroid  US  04/08/23 IMPRESSION: Mildly heterogeneous thyroid  gland without a discrete nodule. - Will refer to endo for further evaluation.   Orders: -     Ambulatory referral to Endocrinology    Follow-up: Return in about 1 year (around 04/27/2025) for physical.   Tona Francis, NP

## 2024-04-27 ENCOUNTER — Ambulatory Visit (INDEPENDENT_AMBULATORY_CARE_PROVIDER_SITE_OTHER): Admitting: Nurse Practitioner

## 2024-04-27 VITALS — BP 118/70 | HR 91 | Temp 97.9°F | Ht 69.0 in | Wt 296.8 lb

## 2024-04-27 DIAGNOSIS — E042 Nontoxic multinodular goiter: Secondary | ICD-10-CM

## 2024-04-27 DIAGNOSIS — Z23 Encounter for immunization: Secondary | ICD-10-CM

## 2024-04-27 DIAGNOSIS — Z124 Encounter for screening for malignant neoplasm of cervix: Secondary | ICD-10-CM

## 2024-04-27 DIAGNOSIS — Z0000 Encounter for general adult medical examination without abnormal findings: Secondary | ICD-10-CM

## 2024-05-21 NOTE — Assessment & Plan Note (Signed)
 Diagnosed in [place] in 2019. - Thyroid  US  in 2020, 2022 and 2024  Thyroid  US  04/08/23 IMPRESSION: Mildly heterogeneous thyroid  gland without a discrete nodule. - Will refer to endo for further evaluation.

## 2024-05-21 NOTE — Assessment & Plan Note (Signed)
 Routine health maintenance is up to date except for tetanus vaccine. Current with flu and COVID vaccinations. - Administer tetanus vaccine. - Encouraged patient to consume a balanced diet and regular exercise regimen. -Advised to see an eye doctor and dentist annually.  -Defer Pap and breast examination, scheduled to gyn at [facility]. Will send us  the record.
